# Patient Record
Sex: Female | Born: 1935 | Race: White | Hispanic: No | Marital: Married
Health system: Southern US, Community
[De-identification: ages and names within clinical notes are randomized; demographics above are authoritative.]

---

## 2005-01-26 ENCOUNTER — Ambulatory Visit: Payer: Self-pay | Admitting: Internal Medicine

## 2006-03-14 ENCOUNTER — Ambulatory Visit: Payer: Self-pay | Admitting: Internal Medicine

## 2006-05-06 ENCOUNTER — Ambulatory Visit: Payer: Self-pay | Admitting: Gastroenterology

## 2006-09-04 ENCOUNTER — Ambulatory Visit: Payer: Self-pay | Admitting: Internal Medicine

## 2007-12-30 ENCOUNTER — Ambulatory Visit: Payer: Self-pay | Admitting: Internal Medicine

## 2008-10-15 ENCOUNTER — Ambulatory Visit: Payer: Self-pay | Admitting: Ophthalmology

## 2008-10-25 ENCOUNTER — Ambulatory Visit: Payer: Self-pay | Admitting: Ophthalmology

## 2009-03-24 ENCOUNTER — Ambulatory Visit: Payer: Self-pay | Admitting: Internal Medicine

## 2009-04-16 ENCOUNTER — Emergency Department: Payer: Self-pay | Admitting: Emergency Medicine

## 2010-05-01 ENCOUNTER — Ambulatory Visit: Payer: Self-pay | Admitting: Internal Medicine

## 2010-11-20 ENCOUNTER — Ambulatory Visit: Payer: Self-pay | Admitting: Gastroenterology

## 2010-11-22 LAB — PATHOLOGY REPORT

## 2011-06-01 ENCOUNTER — Ambulatory Visit: Payer: Self-pay | Admitting: Internal Medicine

## 2011-11-19 ENCOUNTER — Emergency Department: Payer: Self-pay | Admitting: Internal Medicine

## 2011-11-29 ENCOUNTER — Encounter: Payer: Self-pay | Admitting: Internal Medicine

## 2011-12-13 ENCOUNTER — Inpatient Hospital Stay: Payer: Self-pay | Admitting: Internal Medicine

## 2011-12-18 LAB — CBC WITH DIFFERENTIAL/PLATELET
Basophil #: 0.1 10*3/uL (ref 0.0–0.1)
Basophil %: 0.6 %
Eosinophil #: 0.4 10*3/uL (ref 0.0–0.7)
HCT: 32 % — ABNORMAL LOW (ref 35.0–47.0)
HGB: 10.7 g/dL — ABNORMAL LOW (ref 12.0–16.0)
Lymphocyte %: 18.3 %
MCH: 28.6 pg (ref 26.0–34.0)
MCV: 85 fL (ref 80–100)
Monocyte #: 1.2 10*3/uL — ABNORMAL HIGH (ref 0.0–0.7)
Monocyte %: 9 %
Neutrophil %: 69.4 %
RDW: 14.2 % (ref 11.5–14.5)
WBC: 13.4 10*3/uL — ABNORMAL HIGH (ref 3.6–11.0)

## 2011-12-18 LAB — PROTIME-INR: Prothrombin Time: 24.3 secs — ABNORMAL HIGH (ref 11.5–14.7)

## 2011-12-19 LAB — PROTIME-INR: INR: 4.2

## 2011-12-20 LAB — BASIC METABOLIC PANEL
Anion Gap: 9 (ref 7–16)
Calcium, Total: 8.4 mg/dL — ABNORMAL LOW (ref 8.5–10.1)
Chloride: 99 mmol/L (ref 98–107)
Co2: 32 mmol/L (ref 21–32)
Creatinine: 0.58 mg/dL — ABNORMAL LOW (ref 0.60–1.30)
Potassium: 3.7 mmol/L (ref 3.5–5.1)
Sodium: 140 mmol/L (ref 136–145)

## 2011-12-20 LAB — CBC WITH DIFFERENTIAL/PLATELET
Basophil #: 0 10*3/uL (ref 0.0–0.1)
HCT: 31.2 % — ABNORMAL LOW (ref 35.0–47.0)
HGB: 10.2 g/dL — ABNORMAL LOW (ref 12.0–16.0)
Lymphocyte #: 1.8 10*3/uL (ref 1.0–3.6)
Lymphocyte %: 15.1 %
MCH: 28.4 pg (ref 26.0–34.0)
Monocyte #: 1.1 10*3/uL — ABNORMAL HIGH (ref 0.0–0.7)
Neutrophil #: 8.6 10*3/uL — ABNORMAL HIGH (ref 1.4–6.5)
Platelet: 364 10*3/uL (ref 150–440)
RDW: 14.5 % (ref 11.5–14.5)

## 2011-12-20 LAB — PROTIME-INR: Prothrombin Time: 35.9 secs — ABNORMAL HIGH (ref 11.5–14.7)

## 2011-12-21 ENCOUNTER — Encounter: Payer: Self-pay | Admitting: Internal Medicine

## 2011-12-25 LAB — BASIC METABOLIC PANEL
Calcium, Total: 8.4 mg/dL — ABNORMAL LOW (ref 8.5–10.1)
Chloride: 102 mmol/L (ref 98–107)
Co2: 28 mmol/L (ref 21–32)
Creatinine: 0.64 mg/dL (ref 0.60–1.30)
EGFR (African American): >60
Sodium: 138 mmol/L (ref 136–145)

## 2011-12-25 LAB — PROTIME-INR: Prothrombin Time: 24 secs — ABNORMAL HIGH (ref 11.5–14.7)

## 2011-12-25 LAB — CBC WITH DIFFERENTIAL/PLATELET
Basophil %: 0.4 %
Eosinophil #: 0.3 10*3/uL (ref 0.0–0.7)
HGB: 11 g/dL — ABNORMAL LOW (ref 12.0–16.0)
Lymphocyte %: 22.6 %
MCV: 87 fL (ref 80–100)
Monocyte #: 0.7 10*3/uL (ref 0.0–0.7)
Neutrophil #: 6.2 10*3/uL (ref 1.4–6.5)
Neutrophil %: 66.5 %
Platelet: 285 10*3/uL (ref 150–440)
RBC: 3.9 10*6/uL (ref 3.80–5.20)
RDW: 14.7 % — ABNORMAL HIGH (ref 11.5–14.5)
WBC: 9.3 10*3/uL (ref 3.6–11.0)

## 2011-12-27 LAB — PROTIME-INR: INR: 1.8

## 2012-01-03 LAB — PROTIME-INR
INR: 2.3
Prothrombin Time: 25.3 secs — ABNORMAL HIGH (ref 11.5–14.7)

## 2012-01-18 ENCOUNTER — Encounter: Payer: Self-pay | Admitting: Internal Medicine

## 2012-01-25 LAB — URINALYSIS, COMPLETE
Bilirubin,UR: NEGATIVE
Glucose,UR: NEGATIVE mg/dL (ref 0–75)
Ketone: NEGATIVE
Ph: 5 (ref 4.5–8.0)
Protein: 30
RBC,UR: 21 /HPF (ref 0–5)
Specific Gravity: 1.019 (ref 1.003–1.030)
Squamous Epithelial: NONE SEEN
WBC UR: 99 /HPF (ref 0–5)

## 2012-01-30 LAB — URINE CULTURE

## 2012-02-15 ENCOUNTER — Encounter: Payer: Self-pay | Admitting: Internal Medicine

## 2012-02-21 LAB — CBC WITH DIFFERENTIAL/PLATELET
Basophil %: 0.7 %
HCT: 29.5 % — ABNORMAL LOW (ref 35.0–47.0)
HGB: 9.4 g/dL — ABNORMAL LOW (ref 12.0–16.0)
Lymphocyte %: 30.9 %
MCH: 26 pg (ref 26.0–34.0)
MCHC: 31.8 g/dL — ABNORMAL LOW (ref 32.0–36.0)
MCV: 82 fL (ref 80–100)
Neutrophil #: 5.6 10*3/uL (ref 1.4–6.5)
Neutrophil %: 56.8 %
Platelet: 359 10*3/uL (ref 150–440)
RBC: 3.61 10*6/uL — ABNORMAL LOW (ref 3.80–5.20)
RDW: 17 % — ABNORMAL HIGH (ref 11.5–14.5)

## 2012-02-21 LAB — PROTIME-INR
INR: 7.2
Prothrombin Time: 60.7 secs — ABNORMAL HIGH (ref 11.5–14.7)

## 2012-02-22 LAB — PROTIME-INR: INR: 1.9

## 2012-02-25 LAB — PROTIME-INR: Prothrombin Time: 19.5 secs — ABNORMAL HIGH (ref 11.5–14.7)

## 2012-02-26 LAB — PROTIME-INR: INR: 2

## 2012-03-04 LAB — PROTIME-INR: Prothrombin Time: 27.2 secs — ABNORMAL HIGH (ref 11.5–14.7)

## 2012-03-11 LAB — PROTIME-INR
INR: 1.8
Prothrombin Time: 20.9 secs — ABNORMAL HIGH (ref 11.5–14.7)

## 2012-03-17 ENCOUNTER — Encounter: Payer: Self-pay | Admitting: Internal Medicine

## 2012-04-08 LAB — PROTIME-INR: Prothrombin Time: 21.5 secs — ABNORMAL HIGH (ref 11.5–14.7)

## 2012-04-16 ENCOUNTER — Encounter: Payer: Self-pay | Admitting: Internal Medicine

## 2012-04-17 LAB — PROTIME-INR: Prothrombin Time: 25.2 secs — ABNORMAL HIGH (ref 11.5–14.7)

## 2012-05-01 LAB — PROTIME-INR: INR: 1.2

## 2012-05-08 LAB — PROTIME-INR: Prothrombin Time: 16.9 secs — ABNORMAL HIGH (ref 11.5–14.7)

## 2012-05-13 LAB — PROTIME-INR: INR: 1.5

## 2012-05-15 LAB — PROTIME-INR: INR: 2.4

## 2012-05-22 ENCOUNTER — Ambulatory Visit: Payer: Self-pay | Admitting: Orthopedic Surgery

## 2012-05-22 ENCOUNTER — Encounter: Payer: Self-pay | Admitting: Internal Medicine

## 2012-05-22 LAB — PROTIME-INR: INR: 5.1

## 2012-05-26 LAB — PROTIME-INR
INR: 3.6
Prothrombin Time: 35.5 secs — ABNORMAL HIGH (ref 11.5–14.7)

## 2012-05-29 ENCOUNTER — Inpatient Hospital Stay: Payer: Self-pay | Admitting: Internal Medicine

## 2012-05-29 LAB — CBC WITH DIFFERENTIAL/PLATELET
Basophil #: 0.1 10*3/uL (ref 0.0–0.1)
Basophil %: 0.2 %
Eosinophil #: 0.5 10*3/uL (ref 0.0–0.7)
Eosinophil %: 2.3 %
HCT: 27.3 % — ABNORMAL LOW (ref 35.0–47.0)
HGB: 8.4 g/dL — ABNORMAL LOW (ref 12.0–16.0)
Lymphocyte %: 19.4 %
Lymphocyte %: 18.1 %
MCH: 27.3 pg (ref 26.0–34.0)
MCH: 26 pg (ref 26.0–34.0)
MCHC: 33 g/dL (ref 32.0–36.0)
MCV: 83 fL (ref 80–100)
MCV: 83 fL (ref 80–100)
Monocyte %: 7 %
Neutrophil #: 12.2 10*3/uL — ABNORMAL HIGH (ref 1.4–6.5)
Neutrophil %: 70.8 %
Neutrophil %: 70.5 %
Platelet: 474 10*3/uL — ABNORMAL HIGH (ref 150–440)
Platelet: 462 10*3/uL — ABNORMAL HIGH (ref 150–440)
RDW: 17.4 % — ABNORMAL HIGH (ref 11.5–14.5)
WBC: 15.5 10*3/uL — ABNORMAL HIGH (ref 3.6–11.0)

## 2012-05-29 LAB — COMPREHENSIVE METABOLIC PANEL
Alkaline Phosphatase: 105 U/L (ref 50–136)
Anion Gap: 11 (ref 7–16)
BUN: 21 mg/dL — ABNORMAL HIGH (ref 7–18)
Bilirubin,Total: 1.3 mg/dL — ABNORMAL HIGH (ref 0.2–1.0)
Calcium, Total: 8.3 mg/dL — ABNORMAL LOW (ref 8.5–10.1)
Chloride: 99 mmol/L (ref 98–107)
Co2: 28 mmol/L (ref 21–32)
Creatinine: 0.59 mg/dL — ABNORMAL LOW (ref 0.60–1.30)
EGFR (African American): >60
Osmolality: 280 (ref 275–301)
Potassium: 3.1 mmol/L — ABNORMAL LOW (ref 3.5–5.1)
SGOT(AST): 31 U/L (ref 15–37)
SGPT (ALT): 19 U/L
Sodium: 138 mmol/L (ref 136–145)
Total Protein: 6.7 g/dL (ref 6.4–8.2)

## 2012-05-29 LAB — URINALYSIS, COMPLETE
Blood: NEGATIVE
Nitrite: POSITIVE
Protein: 30
Specific Gravity: 1.024 (ref 1.003–1.030)

## 2012-05-29 LAB — CBC
HCT: 19.8 % — ABNORMAL LOW (ref 35.0–47.0)
HGB: 6.3 g/dL — ABNORMAL LOW (ref 12.0–16.0)
MCH: 25.2 pg — ABNORMAL LOW (ref 26.0–34.0)
MCV: 79 fL — ABNORMAL LOW (ref 80–100)

## 2012-05-29 LAB — PROTIME-INR
INR: 2.7
Prothrombin Time: 28.7 secs — ABNORMAL HIGH (ref 11.5–14.7)

## 2012-05-29 LAB — TROPONIN I: Troponin-I: 0.05 ng/mL

## 2012-05-29 LAB — OCCULT BLOOD X 1 CARD TO LAB, STOOL: Occult Blood, Feces: NEGATIVE

## 2012-05-30 LAB — CBC WITH DIFFERENTIAL/PLATELET
Basophil #: 0 10*3/uL (ref 0.0–0.1)
Basophil %: 0.2 %
Eosinophil %: 2.1 %
HGB: 8 g/dL — ABNORMAL LOW (ref 12.0–16.0)
Lymphocyte #: 2.3 10*3/uL (ref 1.0–3.6)
Lymphocyte %: 13.4 %
MCH: 26.5 pg (ref 26.0–34.0)
MCHC: 32.2 g/dL (ref 32.0–36.0)
MCV: 82 fL (ref 80–100)
Platelet: 481 10*3/uL — ABNORMAL HIGH (ref 150–440)
RBC: 3.02 10*6/uL — ABNORMAL LOW (ref 3.80–5.20)
RDW: 17.4 % — ABNORMAL HIGH (ref 11.5–14.5)

## 2012-05-30 LAB — BASIC METABOLIC PANEL
Anion Gap: 11 (ref 7–16)
Calcium, Total: 8.5 mg/dL (ref 8.5–10.1)
EGFR (Non-African Amer.): >60
Osmolality: 284 (ref 275–301)

## 2012-05-30 LAB — PROTIME-INR
INR: 1.1
Prothrombin Time: 15 secs — ABNORMAL HIGH (ref 11.5–14.7)

## 2012-05-31 LAB — CBC WITH DIFFERENTIAL/PLATELET
Eosinophil #: 0.6 10*3/uL (ref 0.0–0.7)
HGB: 8.2 g/dL — ABNORMAL LOW (ref 12.0–16.0)
MCH: 26.8 pg (ref 26.0–34.0)
MCV: 83 fL (ref 80–100)
Monocyte #: 1.3 x10 3/mm — ABNORMAL HIGH (ref 0.2–0.9)
Monocyte %: 9.7 %
Neutrophil %: 64.3 %
RDW: 17.4 % — ABNORMAL HIGH (ref 11.5–14.5)
WBC: 13 10*3/uL — ABNORMAL HIGH (ref 3.6–11.0)

## 2012-05-31 LAB — BASIC METABOLIC PANEL
Anion Gap: 8 (ref 7–16)
Calcium, Total: 7.9 mg/dL — ABNORMAL LOW (ref 8.5–10.1)
EGFR (African American): >60
Glucose: 114 mg/dL — ABNORMAL HIGH (ref 65–99)

## 2012-05-31 LAB — PROTIME-INR
INR: 1
Prothrombin Time: 13.8 secs (ref 11.5–14.7)

## 2012-05-31 LAB — URINE CULTURE

## 2012-06-01 LAB — CBC WITH DIFFERENTIAL/PLATELET
Basophil #: 0.1 10*3/uL (ref 0.0–0.1)
Basophil %: 0.4 %
Eosinophil #: 0.6 10*3/uL (ref 0.0–0.7)
Eosinophil %: 5 %
HCT: 26.1 % — ABNORMAL LOW (ref 35.0–47.0)
Lymphocyte %: 24.1 %
MCH: 26.8 pg (ref 26.0–34.0)
MCHC: 32 g/dL (ref 32.0–36.0)
MCV: 84 fL (ref 80–100)
Monocyte #: 1 x10 3/mm — ABNORMAL HIGH (ref 0.2–0.9)
Neutrophil #: 7.9 10*3/uL — ABNORMAL HIGH (ref 1.4–6.5)
Platelet: 454 10*3/uL — ABNORMAL HIGH (ref 150–440)
RBC: 3.12 10*6/uL — ABNORMAL LOW (ref 3.80–5.20)
WBC: 12.7 10*3/uL — ABNORMAL HIGH (ref 3.6–11.0)

## 2012-06-01 LAB — BASIC METABOLIC PANEL
Anion Gap: 10 (ref 7–16)
BUN: 6 mg/dL — ABNORMAL LOW (ref 7–18)
Calcium, Total: 8.2 mg/dL — ABNORMAL LOW (ref 8.5–10.1)
Co2: 27 mmol/L (ref 21–32)
Creatinine: 0.46 mg/dL — ABNORMAL LOW (ref 0.60–1.30)
EGFR (Non-African Amer.): >60
Potassium: 3.4 mmol/L — ABNORMAL LOW (ref 3.5–5.1)

## 2012-06-01 LAB — PROTIME-INR: INR: 1.1

## 2012-06-02 LAB — CBC WITH DIFFERENTIAL/PLATELET
Basophil %: 0.5 %
Eosinophil #: 0.8 10*3/uL — ABNORMAL HIGH (ref 0.0–0.7)
HCT: 27.3 % — ABNORMAL LOW (ref 35.0–47.0)
HGB: 8.6 g/dL — ABNORMAL LOW (ref 12.0–16.0)
Lymphocyte #: 2.7 10*3/uL (ref 1.0–3.6)
Lymphocyte %: 19.1 %
MCHC: 31.6 g/dL — ABNORMAL LOW (ref 32.0–36.0)
Monocyte %: 8 %
Monocytes: 3 %
Neutrophil #: 9.5 10*3/uL — ABNORMAL HIGH (ref 1.4–6.5)
RBC: 3.27 10*6/uL — ABNORMAL LOW (ref 3.80–5.20)
RDW: 18.1 % — ABNORMAL HIGH (ref 11.5–14.5)
Segmented Neutrophils: 67 %
WBC: 14.2 10*3/uL — ABNORMAL HIGH (ref 3.6–11.0)

## 2012-06-02 LAB — BASIC METABOLIC PANEL
Anion Gap: 7 (ref 7–16)
BUN: 7 mg/dL (ref 7–18)
Chloride: 108 mmol/L — ABNORMAL HIGH (ref 98–107)
Creatinine: 0.49 mg/dL — ABNORMAL LOW (ref 0.60–1.30)
EGFR (African American): >60
EGFR (Non-African Amer.): >60
Osmolality: 285 (ref 275–301)
Potassium: 4.1 mmol/L (ref 3.5–5.1)

## 2012-06-02 LAB — TSH: Thyroid Stimulating Horm: 2.95 u[IU]/mL

## 2012-06-03 LAB — CSF CELL CT + PROT + GLU PANEL
Lymphocytes: 84 %
Monocytes/Macrophages: 4 %
Neutrophils: 12 %
Protein, CSF: 46 mg/dL — ABNORMAL HIGH (ref 15–45)
WBC (CSF): 3 /mm3

## 2012-06-03 LAB — PROTIME-INR
INR: 1.1
Prothrombin Time: 14.1 secs (ref 11.5–14.7)

## 2012-06-03 LAB — APTT: Activated PTT: 31.2 secs (ref 23.6–35.9)

## 2012-06-03 LAB — INDIA INK CSF

## 2012-06-04 LAB — BASIC METABOLIC PANEL WITH GFR
Anion Gap: 9
BUN: 6 mg/dL — ABNORMAL LOW
Calcium, Total: 8.1 mg/dL — ABNORMAL LOW
Chloride: 107 mmol/L
Co2: 25 mmol/L
Creatinine: 0.65 mg/dL
EGFR (African American): >60
EGFR (Non-African Amer.): >60
Glucose: 132 mg/dL — ABNORMAL HIGH
Osmolality: 281
Potassium: 3.3 mmol/L — ABNORMAL LOW
Sodium: 141 mmol/L

## 2012-06-04 LAB — CBC WITH DIFFERENTIAL/PLATELET
Eosinophil %: 3.3 %
HCT: 27.4 % — ABNORMAL LOW (ref 35.0–47.0)
Lymphocyte #: 1.9 10*3/uL (ref 1.0–3.6)
MCV: 83 fL (ref 80–100)
Monocyte #: 1.1 x10 3/mm — ABNORMAL HIGH (ref 0.2–0.9)
Monocyte %: 8.4 %
Neutrophil #: 9.2 10*3/uL — ABNORMAL HIGH (ref 1.4–6.5)
Neutrophil %: 73.2 %
Platelet: 373 10*3/uL (ref 150–440)
RDW: 18.6 % — ABNORMAL HIGH (ref 11.5–14.5)
WBC: 12.6 10*3/uL — ABNORMAL HIGH (ref 3.6–11.0)

## 2012-06-04 LAB — CULTURE, BLOOD (SINGLE)

## 2012-06-05 LAB — CBC WITH DIFFERENTIAL/PLATELET
Basophil #: 0 10*3/uL (ref 0.0–0.1)
Basophil %: 0.4 %
Eosinophil #: 0 10*3/uL (ref 0.0–0.7)
Eosinophil %: 0 %
HCT: 29 % — ABNORMAL LOW (ref 35.0–47.0)
Lymphocyte %: 13.1 %
MCH: 27.2 pg (ref 26.0–34.0)
MCHC: 32.6 g/dL (ref 32.0–36.0)
Monocyte %: 0.6 %
Neutrophil #: 8.2 10*3/uL — ABNORMAL HIGH (ref 1.4–6.5)
Platelet: 367 10*3/uL (ref 150–440)
WBC: 9.5 10*3/uL (ref 3.6–11.0)

## 2012-06-05 LAB — BASIC METABOLIC PANEL
Anion Gap: 5 — ABNORMAL LOW (ref 7–16)
Co2: 28 mmol/L (ref 21–32)
EGFR (African American): >60
Glucose: 169 mg/dL — ABNORMAL HIGH (ref 65–99)
Potassium: 4.2 mmol/L (ref 3.5–5.1)

## 2012-06-06 LAB — CBC WITH DIFFERENTIAL/PLATELET
Basophil #: 0 10*3/uL (ref 0.0–0.1)
Basophil %: 0.1 %
Eosinophil #: 0 10*3/uL (ref 0.0–0.7)
Eosinophil %: 0 %
HCT: 28.7 % — ABNORMAL LOW (ref 35.0–47.0)
HGB: 9.3 g/dL — ABNORMAL LOW (ref 12.0–16.0)
Lymphocyte %: 11.9 %
MCH: 27.2 pg (ref 26.0–34.0)
MCV: 84 fL (ref 80–100)
Monocyte %: 3.5 %
Neutrophil #: 12.1 10*3/uL — ABNORMAL HIGH (ref 1.4–6.5)
Neutrophil %: 84.5 %
Platelet: 398 10*3/uL (ref 150–440)
RDW: 18.8 % — ABNORMAL HIGH (ref 11.5–14.5)
WBC: 14.3 10*3/uL — ABNORMAL HIGH (ref 3.6–11.0)

## 2012-06-08 LAB — CBC WITH DIFFERENTIAL/PLATELET
Basophil #: 0 10*3/uL (ref 0.0–0.1)
Eosinophil #: 0 10*3/uL (ref 0.0–0.7)
Eosinophil %: 0.4 %
Lymphocyte #: 2.5 10*3/uL (ref 1.0–3.6)
MCH: 26.4 pg (ref 26.0–34.0)
Monocyte #: 0.8 x10 3/mm (ref 0.2–0.9)
Monocyte %: 7.9 %
Platelet: 353 10*3/uL (ref 150–440)
RBC: 3.64 10*6/uL — ABNORMAL LOW (ref 3.80–5.20)
RDW: 19 % — ABNORMAL HIGH (ref 11.5–14.5)

## 2012-06-08 LAB — BASIC METABOLIC PANEL
Anion Gap: 7 (ref 7–16)
Calcium, Total: 8.4 mg/dL — ABNORMAL LOW (ref 8.5–10.1)
Chloride: 103 mmol/L (ref 98–107)
Co2: 28 mmol/L (ref 21–32)
Creatinine: 0.67 mg/dL (ref 0.60–1.30)
EGFR (African American): >60
EGFR (Non-African Amer.): >60
Glucose: 86 mg/dL (ref 65–99)

## 2012-06-10 LAB — BASIC METABOLIC PANEL
Anion Gap: 8 (ref 7–16)
BUN: 9 mg/dL (ref 7–18)
Calcium, Total: 8.1 mg/dL — ABNORMAL LOW (ref 8.5–10.1)
Chloride: 104 mmol/L (ref 98–107)
Co2: 27 mmol/L (ref 21–32)
Creatinine: 0.57 mg/dL — ABNORMAL LOW (ref 0.60–1.30)
EGFR (African American): >60
EGFR (Non-African Amer.): >60
Glucose: 100 mg/dL — ABNORMAL HIGH (ref 65–99)
Osmolality: 276 (ref 275–301)
Potassium: 3.3 mmol/L — ABNORMAL LOW (ref 3.5–5.1)
Sodium: 139 mmol/L (ref 136–145)

## 2012-06-11 LAB — BASIC METABOLIC PANEL
Anion Gap: 8 (ref 7–16)
BUN: 14 mg/dL (ref 7–18)
Calcium, Total: 8.9 mg/dL (ref 8.5–10.1)
Chloride: 102 mmol/L (ref 98–107)
Creatinine: 0.86 mg/dL (ref 0.60–1.30)
EGFR (African American): >60
EGFR (Non-African Amer.): >60
Glucose: 122 mg/dL — ABNORMAL HIGH (ref 65–99)

## 2012-06-12 LAB — BASIC METABOLIC PANEL
Anion Gap: 7 (ref 7–16)
BUN: 16 mg/dL (ref 7–18)
Calcium, Total: 8.6 mg/dL (ref 8.5–10.1)
Chloride: 104 mmol/L (ref 98–107)
Co2: 30 mmol/L (ref 21–32)
Creatinine: 0.79 mg/dL (ref 0.60–1.30)
EGFR (African American): >60
Glucose: 115 mg/dL — ABNORMAL HIGH (ref 65–99)
Osmolality: 283 (ref 275–301)
Sodium: 141 mmol/L (ref 136–145)

## 2012-06-12 LAB — SEDIMENTATION RATE: Erythrocyte Sed Rate: 13 mm/hr (ref 0–30)

## 2012-06-13 LAB — BASIC METABOLIC PANEL
BUN: 17 mg/dL (ref 7–18)
Co2: 31 mmol/L (ref 21–32)
EGFR (African American): >60
Glucose: 120 mg/dL — ABNORMAL HIGH (ref 65–99)
Potassium: 4.1 mmol/L (ref 3.5–5.1)
Sodium: 140 mmol/L (ref 136–145)

## 2012-06-16 ENCOUNTER — Encounter: Payer: Self-pay | Admitting: Internal Medicine

## 2012-07-17 ENCOUNTER — Encounter: Payer: Self-pay | Admitting: Internal Medicine

## 2012-07-17 DEATH — deceased

## 2013-05-27 IMAGING — CT CT HEAD WITHOUT CONTRAST
2 series · 15 of 30 positions shown, 19 images · non-contrast
Comparison: none

REASON FOR EXAM: weak r side slurred speach
COMMENTS:

[Series 2: without · axial · non-contrast · 0.40mm/px · z∈[+371,+496]mm · 13 of 31 slices shown, 17 images]
[im 3/31  brain]
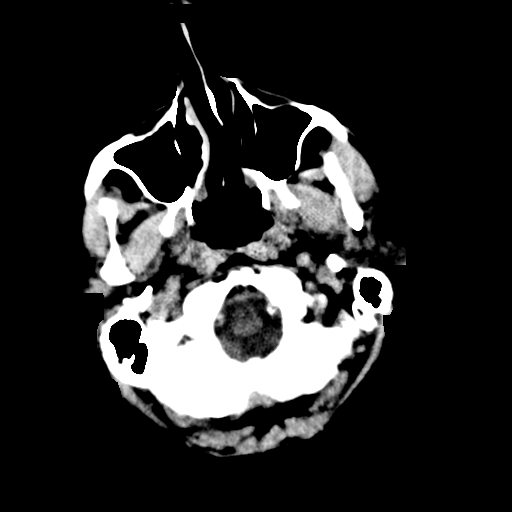
[im 3/31  bone]
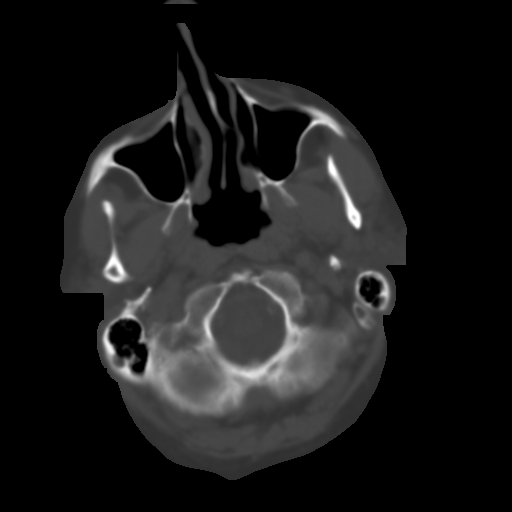
[im 5/31  brain]
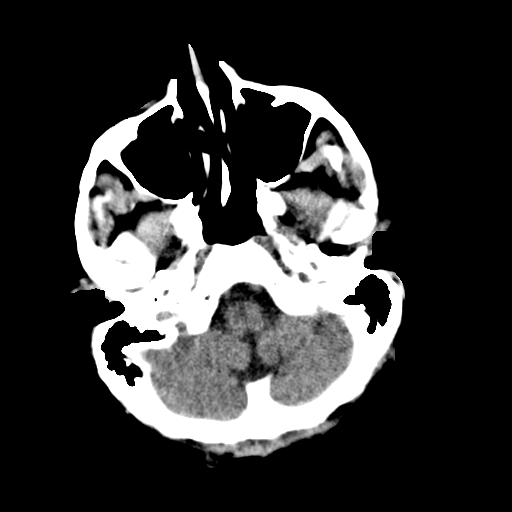
[im 7/31  brain]
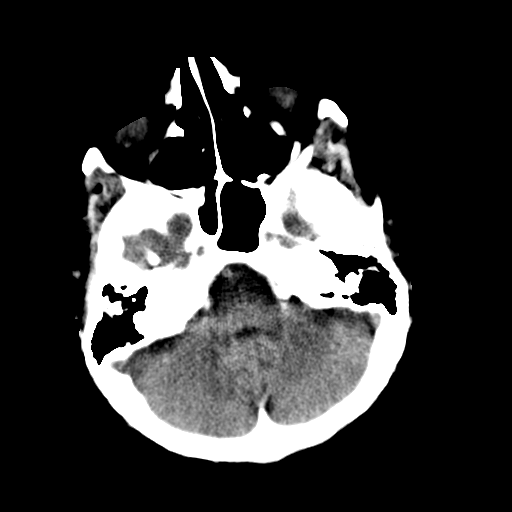
[im 9/31  brain]
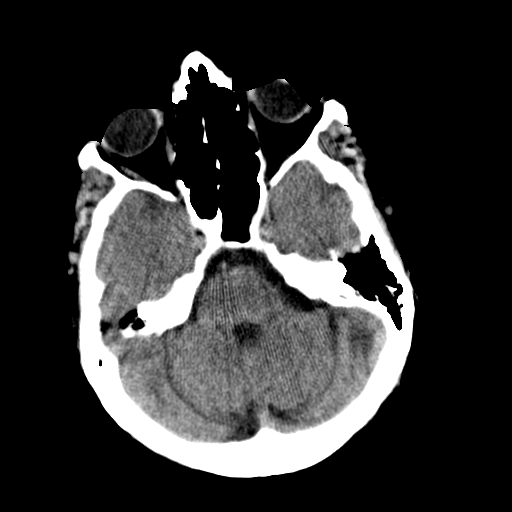
[im 11/31  brain]
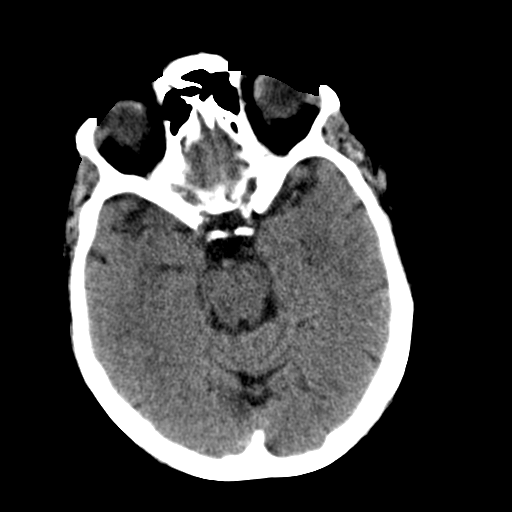
[im 11/31  bone]
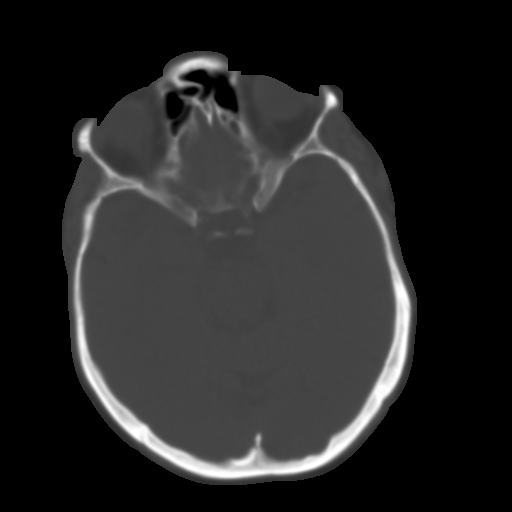
[im 13/31  brain]
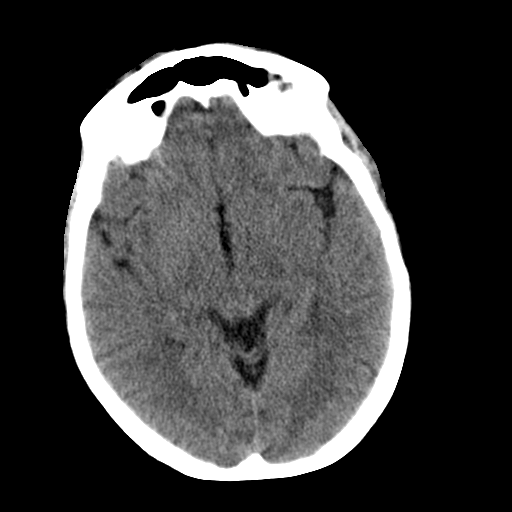
[im 16/31  brain]
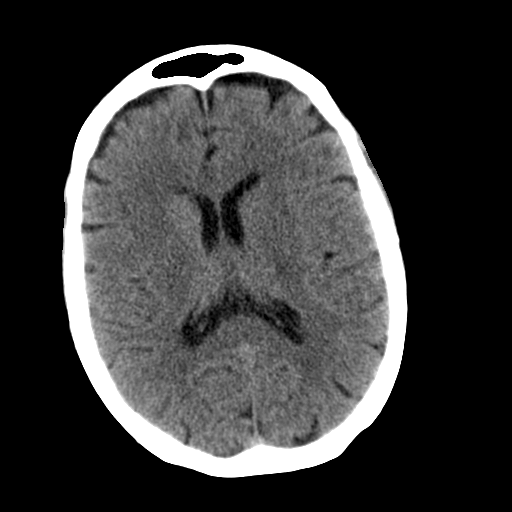
[im 18/31  brain]
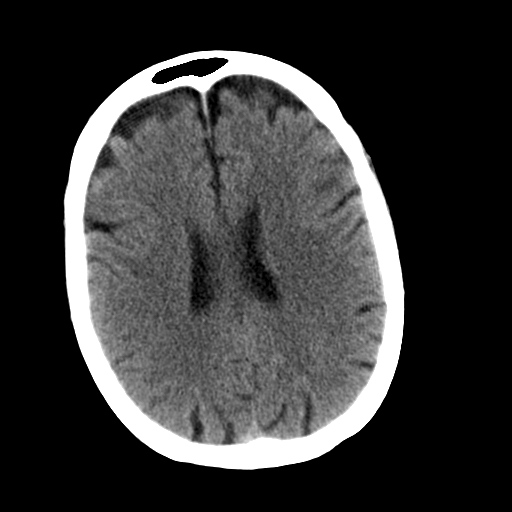
[im 20/31  brain]
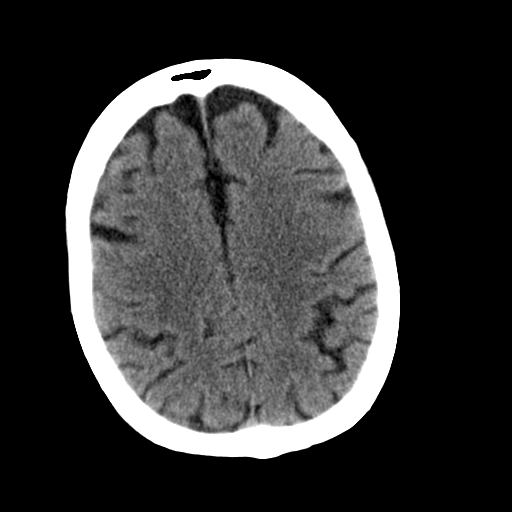
[im 20/31  bone]
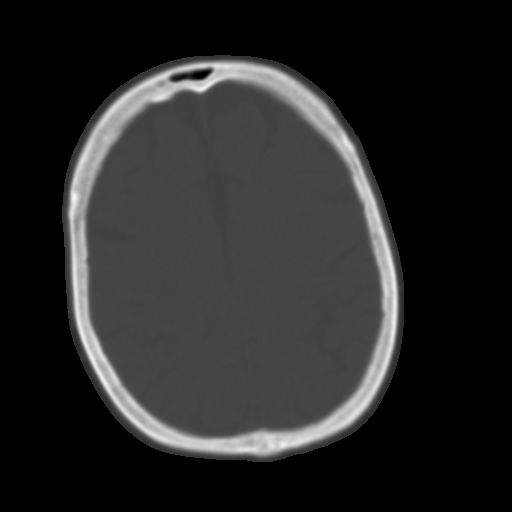
[im 22/31  brain]
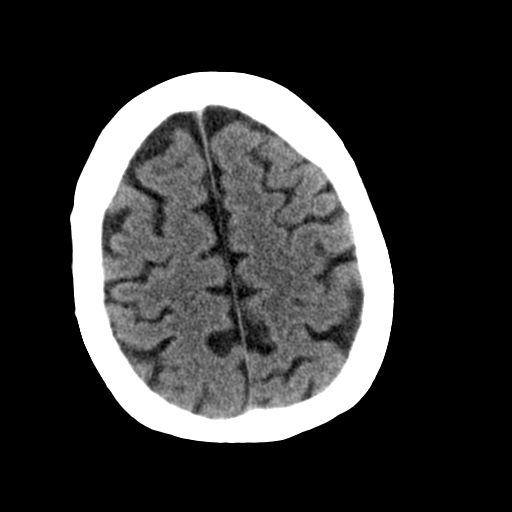
[im 24/31  brain]
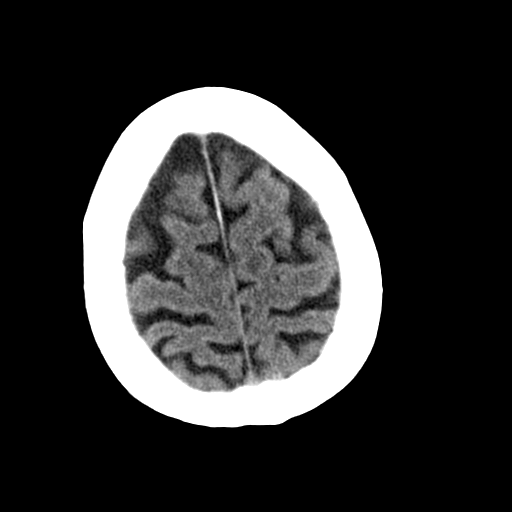
[im 26/31  brain]
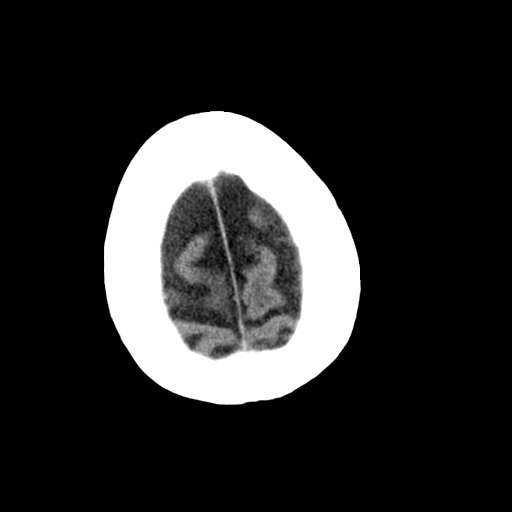
[im 28/31  brain]
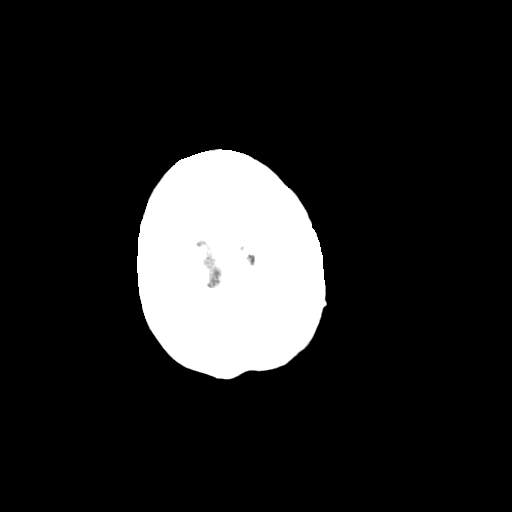
[im 28/31  bone]
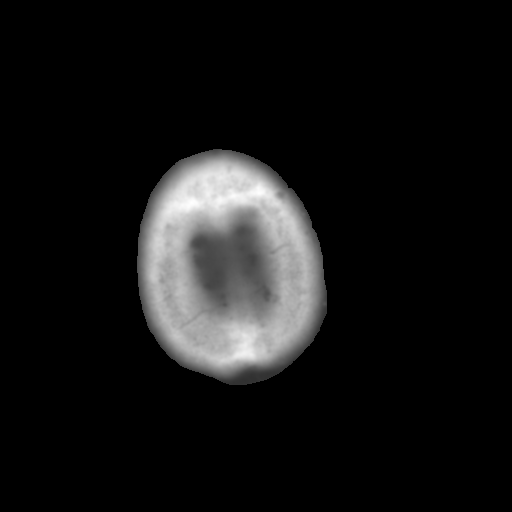

[Series 3: bone · axial · 0.40mm/px · z∈[+371,+391]mm · 2 of 31 slices shown]
[im 3/31  bone]
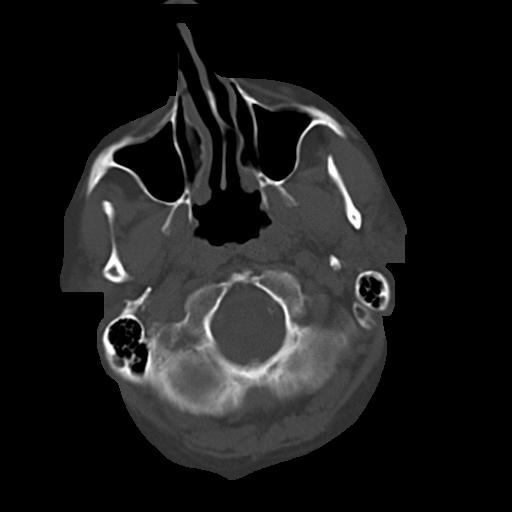
[im 7/31  bone]
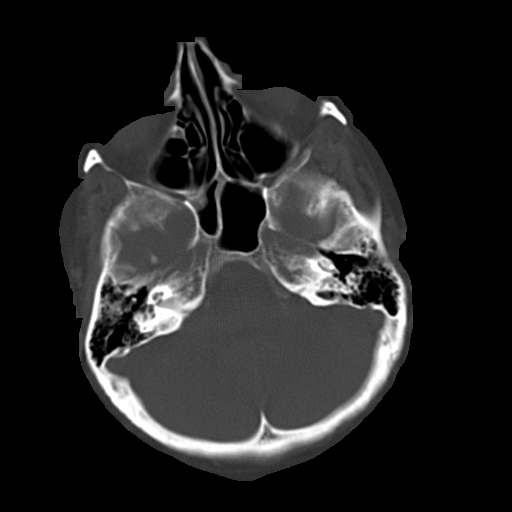

[15 of 30 positions shown; findings below may reference images not displayed]

PROCEDURE:     CT  - CT HEAD WITHOUT CONTRAST  - November 19, 2011  [DATE]

RESULT:     Axial noncontrast CT scanning was performed through the brain
with reconstructions at 5 mm intervals and slice thicknesses.

There is an abnormal appearance of the left middle cerebral artery
consistent with a hyperdense MCA sign which can be seen with early ischemic
change. There is subtle decreased density in the left cerebral hemisphere as
compared to the right. There is no shift of the midline. There is no acute
intracranial hemorrhage. There is mild diffuse cerebral atrophy. The
cerebellum and brainstem are normal in density. At bone window settings the
observed portions of the paranasal sinuses and mastoid air cells are clear.
There is no evidence of an acute skull fracture.
IMPRESSION: 1. I am concerned that there is early ischemic change in the left cerebral
hemisphere manifested chiefly by a hyperdense left middle cerebral artery
which may reflect acute thrombus. Subtle decreased density in the left
cerebral hemisphere as compared to the right is present as well which is
worrisome for evolving ischemic change.
2. There is no shift of the midline.
3. There is no acute intracranial hemorrhage.

This report was called by me to Dr. Locklear in the [HOSPITAL]
the conclusion of the study.

## 2013-05-27 IMAGING — CR DG CHEST 2V
1 series · 4 of 4 positions shown · non-contrast
Comparison: none

REASON FOR EXAM: cp
COMMENTS:

[Series 1: x chest ap · 0.14mm/px · 4 of 4 slices shown]
[im 1/4]
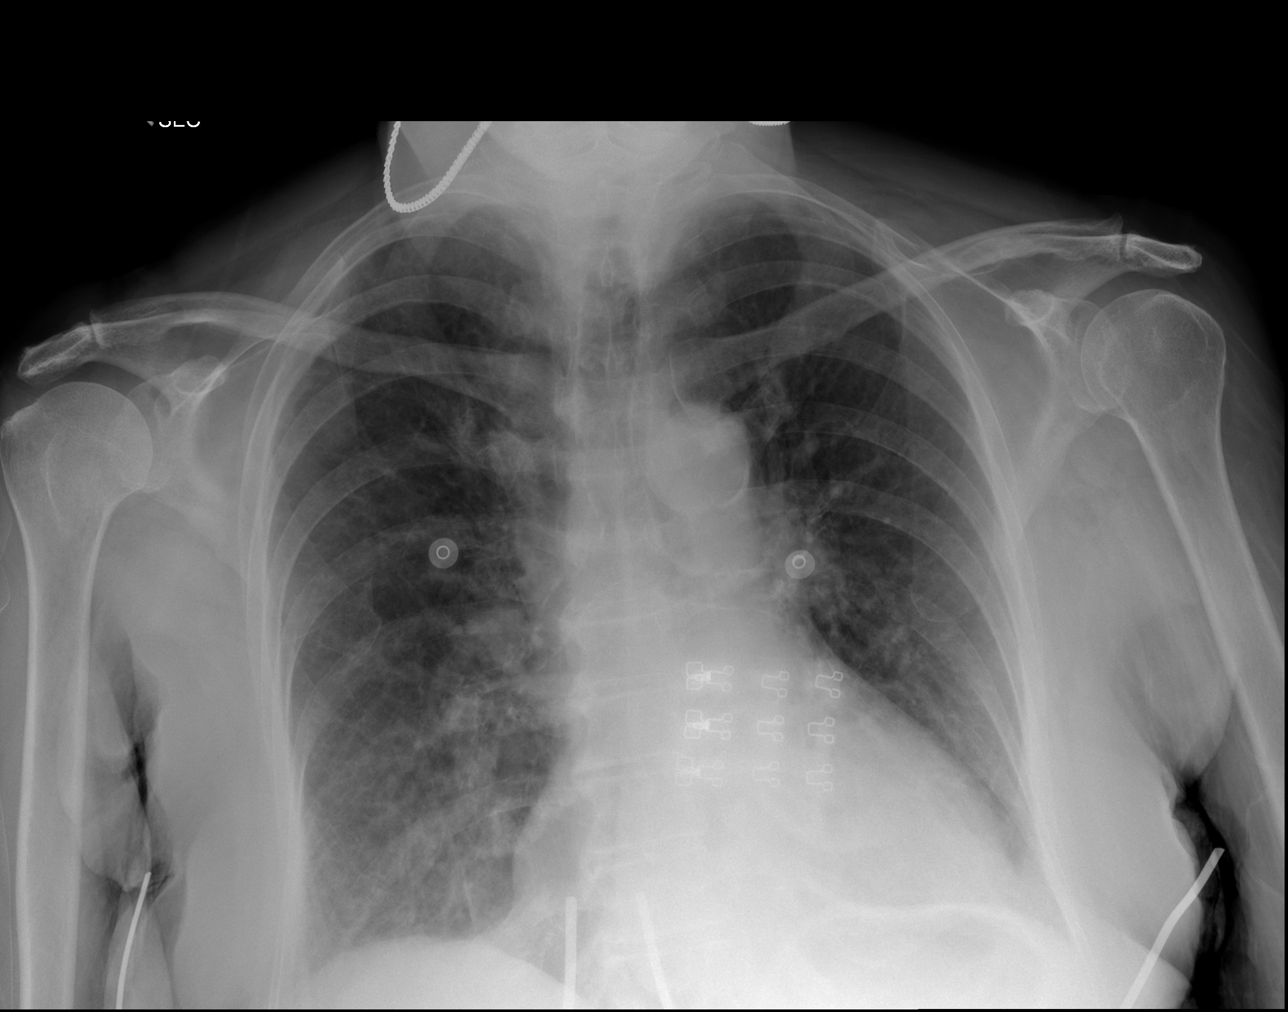
[im 2/4]
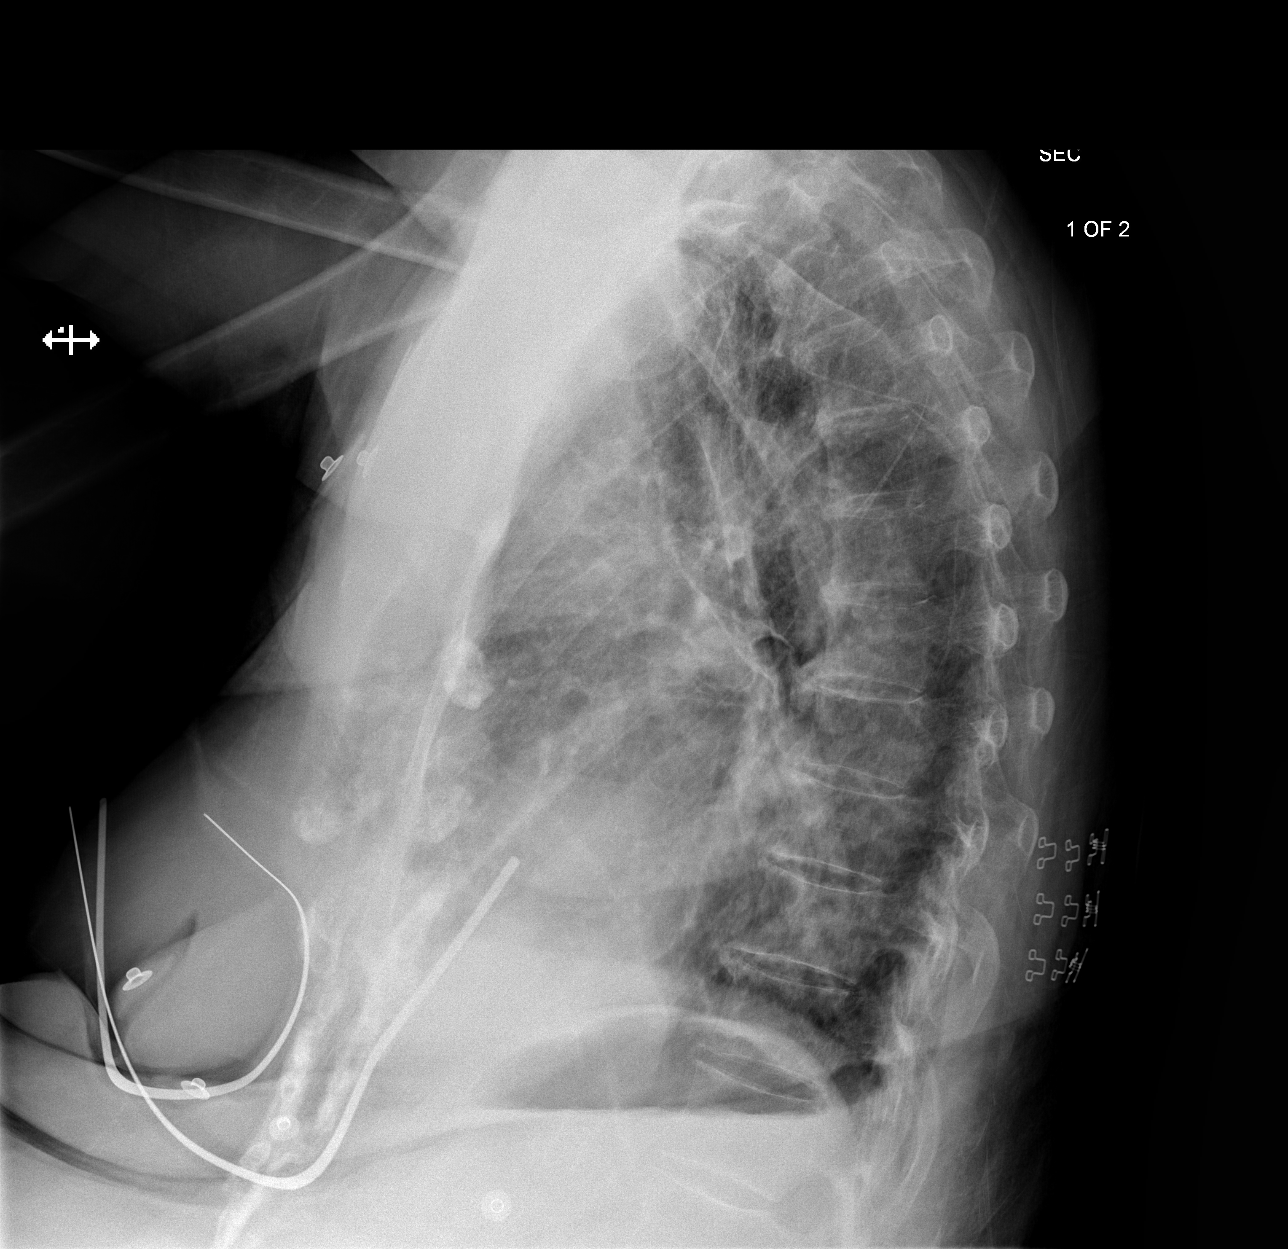
[im 3/4]
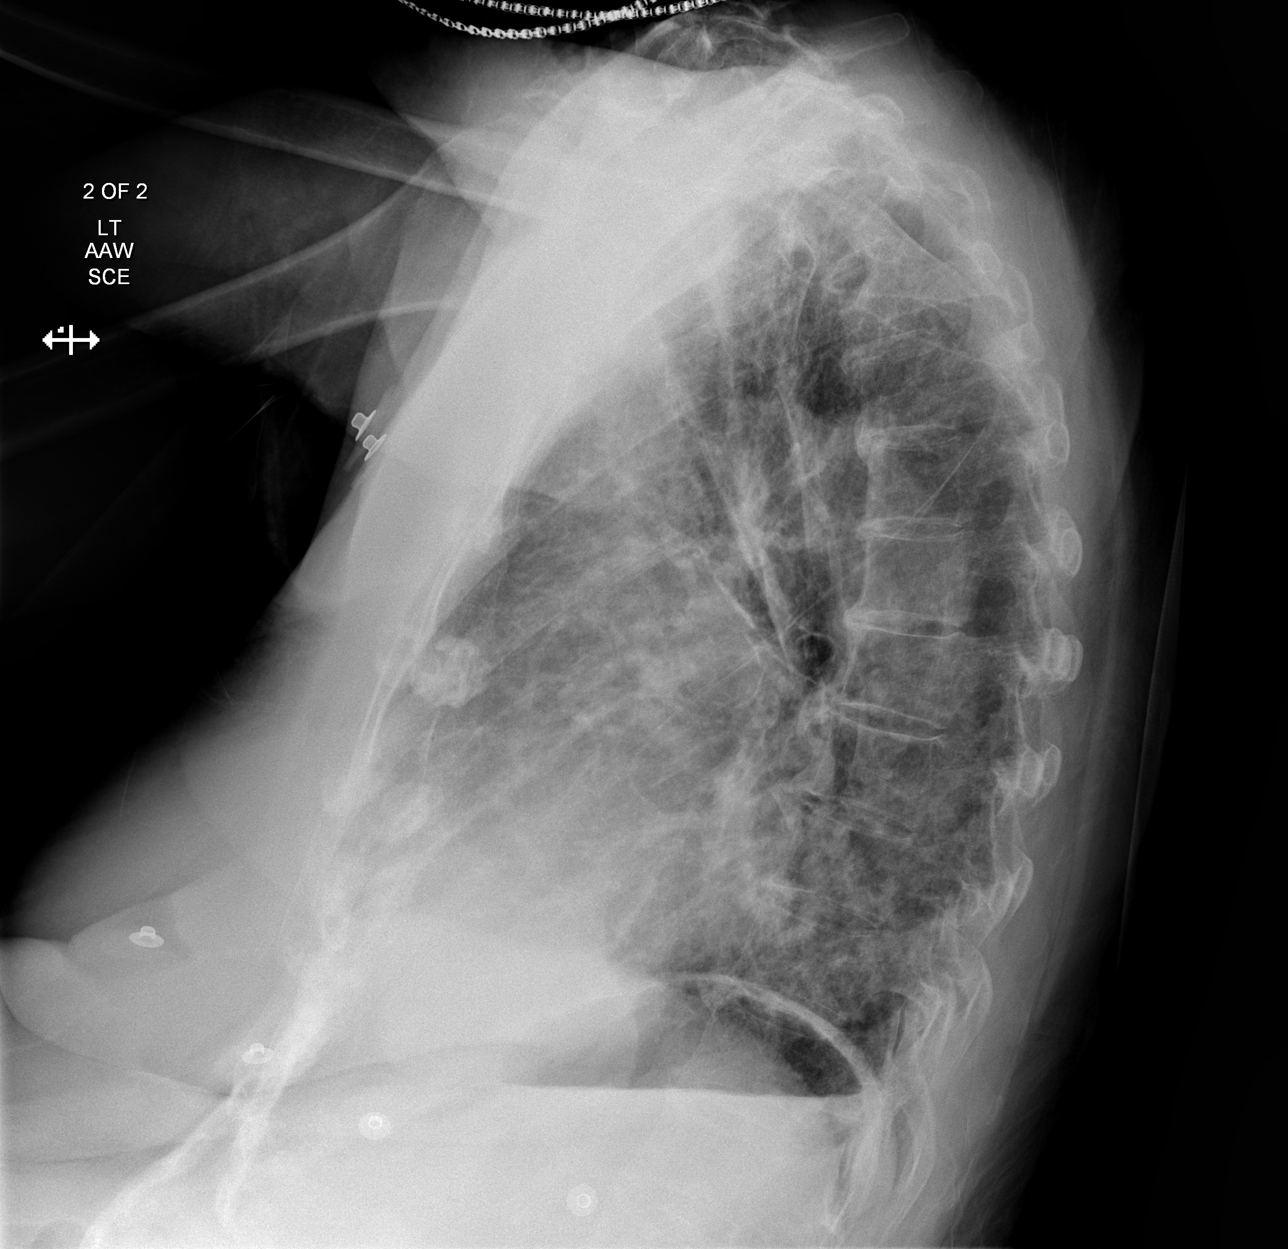
[im 4/4]
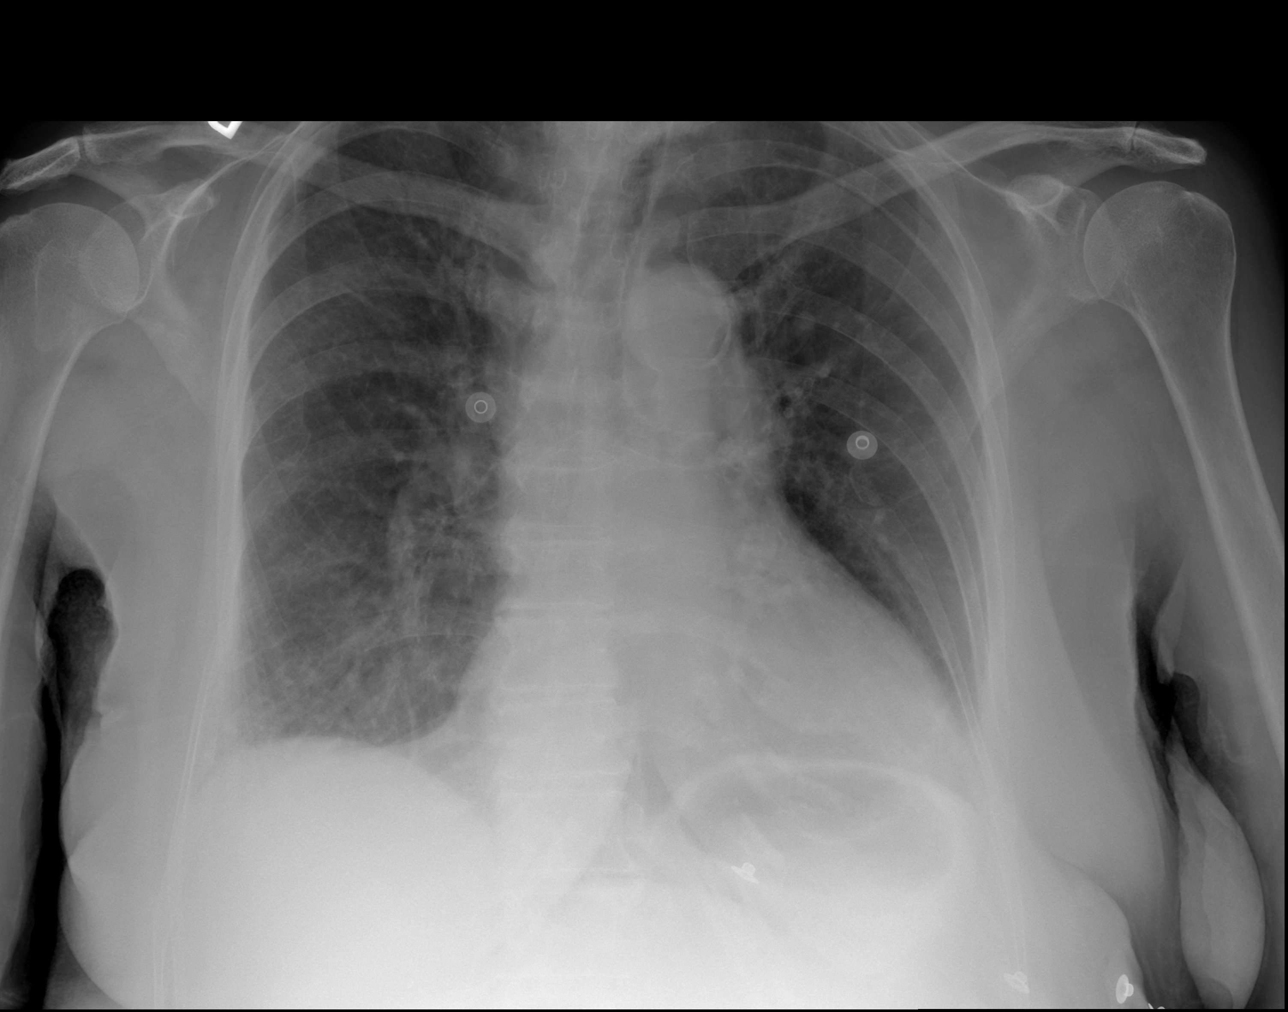

[4 of 4 positions shown; findings below may reference images not displayed]

PROCEDURE:     DXR - DXR CHEST PA (OR AP) AND LATERAL  - November 19, 2011  [DATE]

RESULT:     Comparison made to the study April 17, 2009.

The lungs are hyperinflated with hemidiaphragm flattening. The cardiac
silhouette is mildly enlarged. The pulmonary vascularity is less distinct
today. I see no alveolar infiltrate or pleural effusion. There are mild
degenerative disc changes of the thoracic spine.
IMPRESSION: There are findings consistent with COPD. I do not see
evidence of pneumonia. I cannot exclude low-grade compensated CHF.

## 2013-06-20 IMAGING — CT CT NECK WITH CONTRAST
1 of 3 series · 6 of 14 positions shown, 8 images · IV contrast (agent unspecified)
Comparison: none

REASON FOR EXAM: atraumatic pain, swelling
COMMENTS:

PROCEDURE:     CT  - CT NECK WITH CONTRAST  - December 13, 2011  [DATE]
RESULT:     History: Soft tissue swelling and pain.
Comparison Study: Head CT of 11/19/2011.

[Series 2: soft tissue · axial · 0.51mm/px · z∈[+47,+245]mm · 6 of 94 slices shown, 8 images]
[im 14/94  soft-tissue]
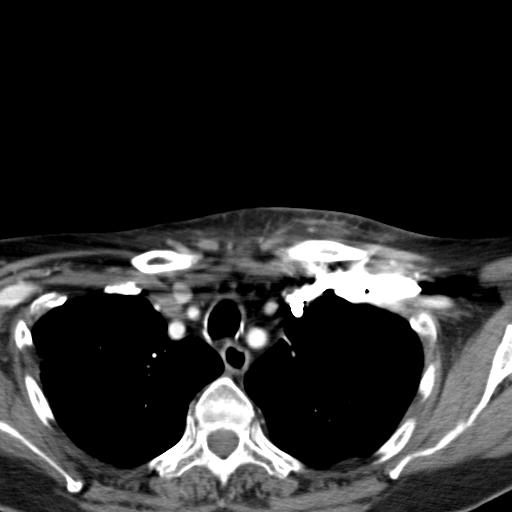
[im 14/94  bone]
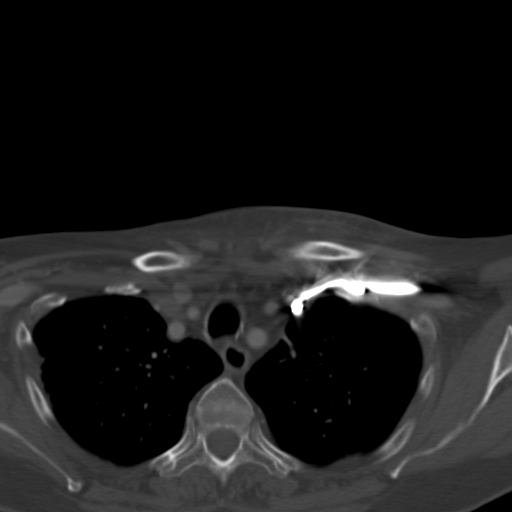
[im 27/94  bone]
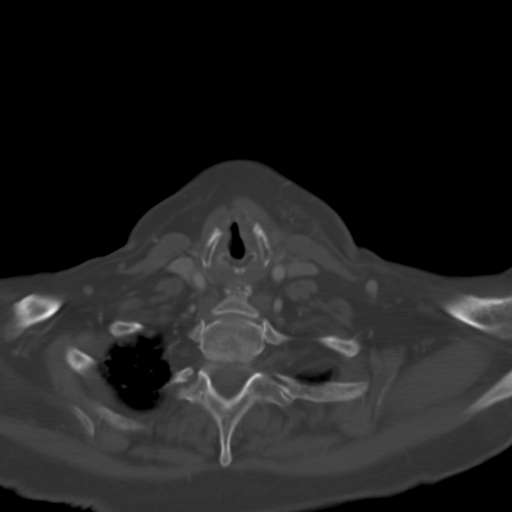
[im 40/94  bone]
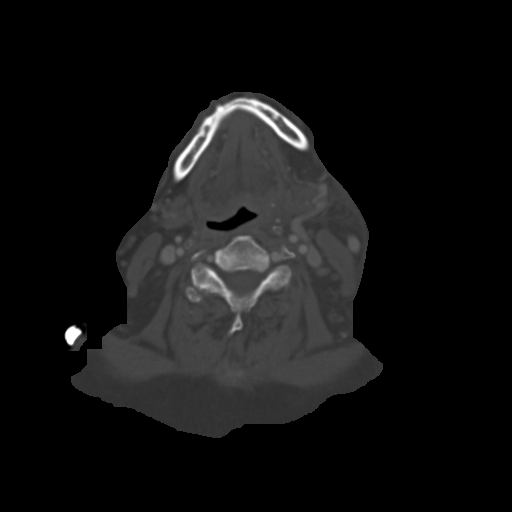
[im 54/94  bone]
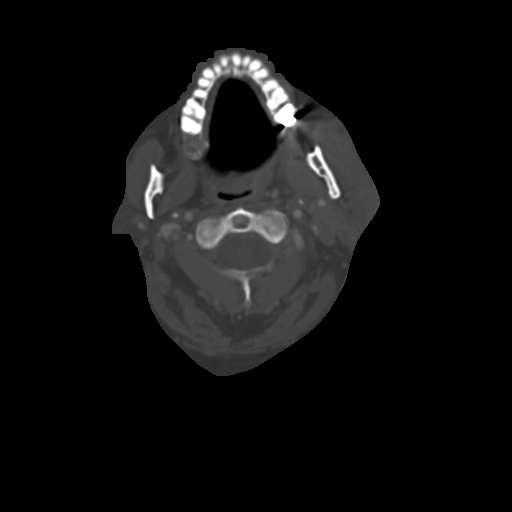
[im 67/94  soft-tissue]
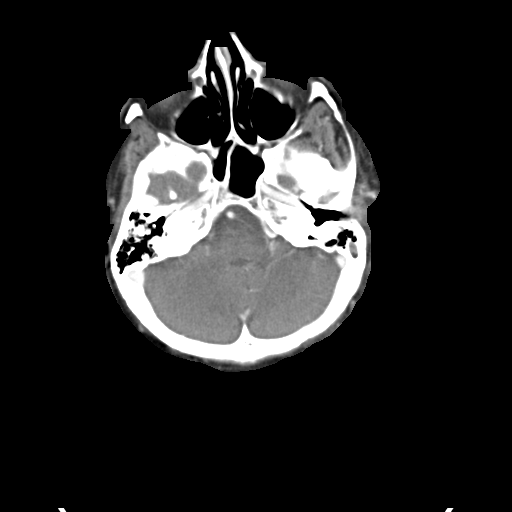
[im 67/94  bone]
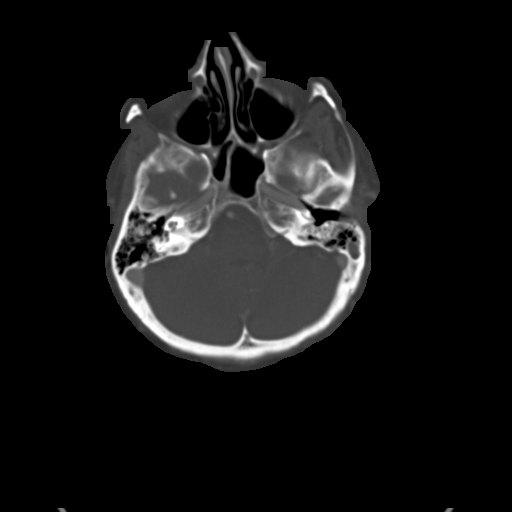
[im 80/94  bone]
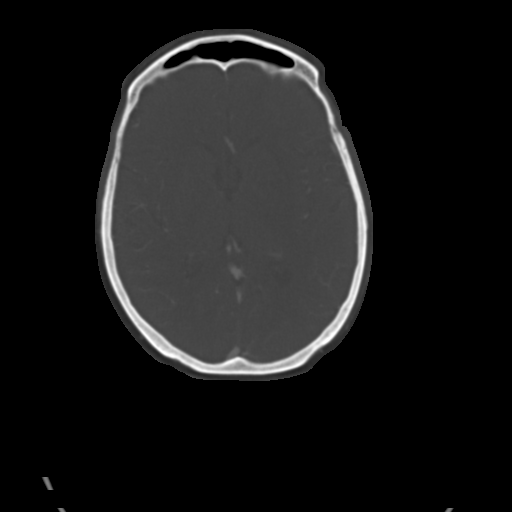

[6 of 14 positions shown; findings below may reference images not displayed]

FINDINGS: Standard CT obtained with 75 cc of [id number]. Paranasal sinuses
clear. Questionable enhancing lesion is noted in the left periventricular
white matter. MRI the brain is suggested for further evaluation. Left
parotid gland is swollen. Parotitis could present this fashion. No clear cut
mass is noted. Mild adjacent soft tissue swelling present Submandibular the
left is also slightly large. Salivary ductal stone cannot be completely
excluded and a followup nonenhanced scan may prove useful. Small left
anterior cervical lymph nodes are noted largest measuring 7 mm in is in the
left jugular chain. Asymmetric soft tissue fullness is no the left pharynx.
Direct visualization suggested to exclude mass lesion. Larynx is normal.
Thyroid is normal. Lung apices demonstrate interstitial thickening. Mucous
noted in the trachea.
IMPRESSION: 1. Left parotid gland and left submandibular gland enlargement. These
changes may be inflammatory. Salivary ductal stone cannot be excluded on the
left. A followup nonenhanced neck CT suggested. Adjacent mild soft tissue
swelling is present. Component cellulitis cannot be excluded.
2. Soft tissue fullness in the left pharyngeal region mass lesion cannot be
excluded and direct visualization suggested.
3. Questionable small enhancing lesion left periventricular white matter.
MRI the brain suggested.

## 2015-04-10 NOTE — Discharge Summary (Signed)
PATIENT NAMEERIYAH, [name] MR#:  [id number] DATE OF BIRTH:  Jun 15, 1928  DATE OF ADMISSION:  12/13/2011 DATE OF DISCHARGE:  12/20/2011  FINAL DIAGNOSES:  1. Left-sided parotitis and cellulitis secondary to methicillin-sensitive Staphylococcus aureus.  2. Dehydration.  3. Recent cerebrovascular accident with right side hemiplegia.  4. Atrial fibrillation.  5. Hypertension.  6. Hyperlipidemia.   HISTORY AND PHYSICAL: Please see dictated admission History and Physical.   [place] COURSE: The patient was admitted with marked swelling in the left side of the jaw, inability to eat secondary to pain, and evidence of markedly elevated white blood cell count of 36,000, consistent with early sepsis. She was placed on broad-spectrum antibiotics. Ear, nose, and throat consultation was obtained, and culture was sent, which revealed methicillin-sensitive Staphylococcus aureus. She was adjusted over to oxacillin and showed continued improvement in her blood counts. She became afebrile as well. She had had recent stroke, was at skilled nursing for this, had PEG tube placed. She had been cleared to start a diet, but had not yet started one. Speech therapy saw the patient, and puree with thin liquids was recommended. Once the swelling in her mouth improved, she was in fact able to tolerate this diet. Her medications have been given through the gastrostomy tube, however, unfortunately this became plugged. Gastroenterology consultation was obtained, however, it could not be cleared. Fortunately her oral intake continued to improve enough that it was felt that she no longer needed tube feedings. Her calorie intake and fluid intake was monitored, and she appeared to be taking this adequately. She was changed over to oral antibiotics and remained afebrile, again tolerating these medications. It was felt that she was stable to return to skilled nursing to resume rehabilitation, and at this time she will be  discharged in stable condition with her physical activity up with assistance as tolerated. She will be on fall precautions, bleeding precautions, and swallowing precautions. Her diet will be puree with thin liquids. She will have a Foley to gravity, as this was attempted to be discontinued during this hospitalization, but she showed continued evidence of urinary retention. Followup appointments will be made with ear, nose and throat to monitor her parotitis, as well as a urology consultation for urinary retention. Physical therapy, occupational therapy, and speech therapy should evaluate and treat the patient. She will need a CBC, MET-B, and INR in four days with results to the nursing home physician.   DISCHARGE MEDICATIONS:  1. Amantadine 100 mg p.o. twice a day; this is being used as an adjunct to increase rehabilitation potential in a post-stroke patient with aphagia.   2. Flomax 0.4 mg p.o. at bedtime for urinary retention.  3. Aspirin chewable 81 mg p.o. daily.  4. Lipitor 40 mg p.o. at bedtime.  5. Cymbalta 60 mg p.o. daily for depression/anxiety.  6. Remeron 15 mg p.o. at bedtime for depression/anxiety.  7. Losartan 25 mg p.o. daily for hypertension.  8. Propranolol 30 mg p.o. twice a day. 9. Coumadin 4.5 mg p.o. at bedtime, with this to be started 12/21/2011.  10. Norco 5/325 mg, 1 p.o. every four hours p.r.n. severe pain.  11. Dicloxacillin 250 mg p.o. four times a day x8 days.  12. Omeprazole 20 mg p.o. daily for gastric protection in this patient on aspirin and Coumadin.   CODE STATUS: During this hospitalization she was DO NOT RESUSCITATE. An out of facility DNR order will be sent with her to the nursing facility.  TIME SPENT ON DISCHARGE:  45 minutes. ____________________________ Adin Hector, MD bjk:slb D: 12/20/2011 07:35:59 ET T: 12/20/2011 07:57:57 ET JOB#: [id number]  cc: Tama High III, MD, <Dictator> Jerene Bears, MD Ramonita Lab MD ELECTRONICALLY SIGNED  12/24/2011 7:38

## 2015-04-10 NOTE — Consult Note (Signed)
PATIENT NAME:  [name], [name] MR#:  [id number] DATE OF BIRTH:  Jul 17, 1928  DATE OF CONSULTATION:  12/17/2011  REFERRING PHYSICIAN:   CONSULTING PHYSICIAN:  Adella HareJ. Wilton Vaudie Engebretsen, MD  HISTORY OF PRESENT ILLNESS: This 81 year old female came in the hospital for treatment of parotiditis. Chief complaint for purposes of surgery consultation is a clogged gastric feeding tube with a previous diagnosis of stroke with dysphagia. She had had [place] feeding [place] in [place] some 19 days ago and the nurse found today that the feeding tube is clogged up. I read Dr. Earnest ConroyElliott's note that he had inserted a wire into the tube trying to get it unstopped but the wire went down to about an inch from the skin and would not pass beyond that point. The nurses have tried to irrigate it multiple times trying to force fluid down the catheter.   PAST MEDICAL HISTORY:  1. She had a stroke recently which paralyzed her right arm and right leg and also developed dysphagia, had the feeding tube inserted, later has been transferred to a skilled nursing facility.  2. Atrial fibrillation. 3. Hyperlipidemia. 4. Hypertension. 5. Irritable bowel disease.   PAST SURGICAL HISTORY:  1. Cataract surgery. 2. Cholecystectomy. 3. Hysterectomy. 4. Tonsillectomy.   MEDICATIONS:  1. Amantadine. 2. Aspirin. 3. Atorvastatin.  4. Cholestyramine. 5. Cymbalta. 6. Hydrochlorothiazide/triamterene. 7. Coumadin. 8. Losartan. 9. Mapap.  10. Mirtazapine. 11. Propranolol. 12. Tylenol.  REVIEW OF SYSTEMS: Patient has multiple questions but nods her head no and does not seem to understand the questions.   PHYSICAL EXAMINATION:  GENERAL: She is awake and alert, says very little.   HEENT: Pupils equal and reactive to light. Extraocular movements are intact. Pharynx is with some mucus.   LUNGS: Lungs sounds with shallow breath sounds are clear.   HEART: Irregular rhythm S1 and S2.   ABDOMEN: Obese and soft, nontender.    LABORATORY, DIAGNOSTIC AND RADIOLOGICAL DATA: The gastrostomy tube was seen in the left upper quadrant. It appears to be a clear tube and it does appear to be some sediment in the tube just above the skin level. I inserted a Foley catheter guidewire with use of K-Y jelly and advanced this down to the sediment within the tube and applied pressure with simultaneous traction on the tube and was unable to relieve the obstruction after a significant attempt and also found that I could not flush it with water.   Also noted her lab work; latest creatinine is 0.53. Her latest white count 13,100, hemoglobin 10.7 and latest pro time was 22.8 seconds.   IMPRESSION:  1. Dysphagia secondary to stroke. 2. Obstructed feeding tube.   PLAN: I discussed this with Dr. Marva PandaSkulskie and also with the Mr. Losee, her husband. I think with the fact it has only been in for 19 days it would not be prudent to remove this tube at the bedside in anticipation of inserting another tube. I think we will need to have her go to endoscopy suite and actually repeat the procedure that had been done before with percutaneous endoscopic gastrostomy and that will anticipate putting in new T-fasteners and also attempt to put in a larger sized tube, hopefully an 18-gauge tube, which is less likely to become obstructed.   Did discuss this with the patient along with the risks and benefits and will make arrangements through Dr. Marva PandaSkulskie for insertion of the new tube.   ____________________________ [name], MD jws:cms D: 12/17/2011 19:30:06 ET T: 12/19/2011 08:41:12 ET JOB#:  [id number]  cc: Adella Hare, MD, <Dictator> Adella Hare MD ELECTRONICALLY SIGNED 01/16/2012 18:13

## 2015-04-10 NOTE — Consult Note (Signed)
Chief Complaint:   Subjective/Chief Complaint patietn seen and examined, chart reviewed. patient says less facial pain.  no n/v or abd pain.  She ate a full liquid diet this am without difficulty.   VITAL SIGNS/ANCILLARY NOTES: **Vital Signs.:   01-Jan-13 06:05   Vital Signs Type Routine   Temperature Temperature (F) 97.6   Celsius 36.4   Temperature Source axillary   Pulse Pulse 70   Pulse source per Dinamap   Respirations Respirations 18   Systolic BP Systolic BP 139   Diastolic BP (mmHg) Diastolic BP (mmHg) 78   Mean BP 98   BP Source Dinamap   Pulse Ox % Pulse Ox % 94   Pulse Ox Activity Level  At rest   Oxygen Delivery Room Air/ 21 %   Brief Assessment:   Cardiac Irregular    Respiratory clear BS    Gastrointestinal details normal Soft  Nontender  Nondistended  No masses palpable  Bowel sounds normal   Routine Hem:  01-Jan-13 00:44    WBC (CBC) -   WBC (CBC) 13.4   RBC (CBC) 3.75   Hemoglobin (CBC) 10.7   Hematocrit (CBC) 32.0   Platelet Count (CBC) 326   MCV 85   MCH 28.6   MCHC 33.6   RDW 14.2  Routine Coag:  01-Jan-13 00:44    Prothrombin 24.3   INR 2.2  Routine Hem:  01-Jan-13 00:44    Neutrophil % 69.4   Lymphocyte % 18.3   Monocyte % 9.0   Eosinophil % 2.7   Basophil % 0.6   Neutrophil # 9.3   Lymphocyte # 2.4   Monocyte # 1.2   Eosinophil # 0.4   Basophil # 0.1   Assessment/Plan:  Assessment/Plan:   Assessment 1) s/p CVA 11/19/11.  history of AF, currently on coumadin, with INR at 2.2. 2) clogged feeding tube. Question of possible replacement with a larger tube.  Patient tolerated full liquids this am and apparently ate without difficulty this am. It seems that as the facial abscess improves the difficulty swallowint is also improving.    Plan 1) discussed with Dr Dan HumphreysWalker and Dr Katrinka BlazingSmith. Will hold plans for changing feeding tube for now as it may actually not be needed. Recommend a bedside swallowing study versus MBSS in the next couple days.    Electronic Signatures: Barnetta ChapelSkulskie, Martin (MD)  (Signed 01-Jan-13 09:34)  Authored: Chief Complaint, VITAL SIGNS/ANCILLARY NOTES, Brief Assessment, Lab Results, Assessment/Plan   Last Updated: 01-Jan-13 09:34 by Barnetta ChapelSkulskie, Martin (MD)

## 2015-04-10 NOTE — Consult Note (Signed)
Referring Physician:  Adin Hector   Primary Care Physician:  [facility], [address] [place], [place] [address], [place] [phone]  Reason for Consult:  Admit Date: 02-Jun-2012   Chief Complaint: AMS   Reason for Consult: altered mental status   History of Present Illness:  History of Present Illness:   81 yo RHD F presents to [facility] secondary to acute onset of altered mental status.  Per husband, pt was in assisted living facility but still able to ambulate and feed herself.  Pt  could not cook or dress herself however and all of this started since she had a stroke on 12/12.  He notes that all of a sudden that she became more confused.  Prior to this, there was note of R knee pain of unknown origin that they thought was neurologic in origin so she tried baclofen which gave her some type of reaction that he was not able to explain so it was stopped.  However, there was no confusion until the other day when he bought her in.  Per husband, cognitively she was functioning quite well from this large stroke and able to communicate without problems.  Since being here, husband notes that she has been hallucinating and not able to tell us anything about herself which is a lot different for her.  He also noted that she stretched her R leg out for the first time when things were the worse with hallucinations.  ROS:   Review of Systems   unable to obtain secondary to mental status yet pt does complain of diffuse pain  Past Medical/Surgical Hx:  CVA/Stroke:   Irritable Bowel Syndrome:   HTN:   Atrial Fibrillation:   Cataract Extraction: RIGHT  Cholecystectomy:   C-Section:   Hysterectomy - Total:   Tonsillectomy:   Past Medical/ Surgical Hx:   Past Medical History L MCA stroke    Past Surgical History PEG placement   Home Medications: Medication Instructions Last Modified Date/Time  propranolol 20 mg oral tablet 1.5 tabs (25m) via tube 2 times a day  (8am, 5pm) for htn. 13-Jun-13 02:32  amantadine 100 mg oral capsule 1 cap(s)via tube once a day at 8am. 13-Jun-13 02:32  aspirin 81 mg oral tablet, chewable 2 tabs (1662m via tube once a day at 8am for anticoagulation. 13-Jun-13 02:32  Cymbalta 60 mg oral delayed release capsule 1 cap(s) via tube once a day at 8am. 13-Jun-13 02:32  Flomax 0.4 mg oral capsule cap(s) orally once a day (at bedtime) 13-Jun-13 02:32  Lipitor 40 mg oral tablet 1 tab(s) orally once a day (at bedtime) 13-Jun-13 02:32  Norco 5 mg-325 mg oral tablet tab(s) orally every 4 hours, As Needed- for Pain  13-Jun-13 02:32  omeprazole 20 mg oral delayed release capsule 1 cap(s) orally once a day 13-Jun-13 02:32  Coumadin 4 mg oral tablet 3 milligram(s) orally once a day (at bedtime) 13-Jun-13 02:32  Senna Plus 50 mg-8.6 mg oral tablet 2 tab(s) orally once a day (at bedtime) 13-Jun-13 02:32  Triple Antibiotic topical ointment Apply topically to affected area once a day 13-Jun-13 02:32  hydrOXYzine hydrochloride 25 mg oral tablet 1 tab(s) orally every 4 hours, As Needed 13-Jun-13 02:32  Milk of Magnesia 8% oral suspension 15 milliliter(s) orally 2 times a day, As Needed- for Constipation  13-Jun-13 02:32  MiraLax oral powder for reconstitution 17 gram(s) orally once a day (at bedtime), As Needed- for Constipation  13-Jun-13 02:32  promethazine 25 mg oral tablet 1 suppository(ies) rectal every 4 hours 13-Jun-13 02:32  Tylenol 325 mg oral tablet 2 tab(s) orally every 4 hours, As Needed- for Fever  13-Jun-13 02:32  Voltaren Topical Apply topically to affected area 4 times a day 13-Jun-13 02:32  CeraVe topical cream Apply topically to affected area once a day 13-Jun-13 02:32  Diaper Rash Ointment  rectal  13-Jun-13 02:23  Fleet Enema  orally  13-Jun-13 02:25  Flector Patch 1.3% topical film, extended release Apply topically to affected area 2 times a day 13-Jun-13 02:32  Skelaxin 800 mg oral tablet 1 tab(s) orally 3 times a day, As  Needed- for Pain  13-Jun-13 02:32  PAIN RELIEF 650MG CR 2 tab(s) orally 2 times a day, As Needed- for Pain  13-Jun-13 02:32   Allergies:  Benadryl: Agitation  Macrobid: Other  Social/Family History:  Employment Status: retired   Lives With: alone   Living Arrangements: assisted living   Social History: no tob, no EtOH, no illicits   Family History: n/c   Vital Signs: **Vital Signs.:   17-Jun-13 10:51   Temperature Temperature (F) 98.5   Celsius 36.9   Temperature Source axillary   Pulse Pulse 81   Respirations Respirations 20   Systolic BP Systolic BP 159   Diastolic BP (mmHg) Diastolic BP (mmHg) 78   Mean BP 94   Pulse Ox % Pulse Ox % 96   Pulse Ox Activity Level  At rest   Oxygen Delivery Room Air/ 21 %   Physical Exam:  General: alert, mild distress, normal weight   HEENT: normocephalic, sclera nonicteric, oropharynx clear   Neck: supple, no JVD, no bruits   Chest: CTA B, no wheezes, no murmurs   Cardiac: irregularly irregular, no murmurs, edema on R UE and LE   Extremities: 2+ edema on R UE and LE, no cyanosis, clonis, moderate contracture of R arm and R leg   Neurologic Exam:  Mental Status: alert but not oriented at all, unable to name and only intermittently follows, moderate dysarthria, echolia noted;  has blank stare on face   Cranial Nerves: PERRLA, EOMI somewhat but will not look completely to L, nl VF to threat, face symmetric, tongue midline, shoulder shrug equal   Motor Exam: 4/5 on L, 1/5 on R with increased tone, there is mild to moderate spascity but there is also a willful component where pt resists passive movement, no tremor   Deep Tendon Reflexes: more brisk 2/4 on R, 1/4 on L, Babinski on R, downgoing on L   Sensory Exam: inconsistent, but pt does note pain with just touching R leg   Coordination: untestable   Lab Results: Hepatic:  13-Jun-13 01:13    Bilirubin, Total  1.3   Alkaline Phosphatase 105   SGPT (ALT) 19 (12-78 NOTE: NEW REFERENCE  RANGE 11/09/2011)   SGOT (AST) 31   Total Protein, Serum 6.7   Albumin, Serum  2.5  LabUnknown:  13-Jun-13 02:49    CEFOXITIN S  Routine BB:  13-Jun-13 03:18    Fresh Frozen Plasma Unit 2 Transfused  Result(s) reported on 30 May 2012 at 03:30PM.   ABO Group + Rh Type A Positive   Antibody Screen NEGATIVE (Result(s) reported on 29 May 2012 at 06:23AM.)   Crossmatch Unit 1 Transfused   Crossmatch Unit 2 Transfused  Result(s) reported on 29 May 2012 at 09:00PM.    22:54    Fresh Frozen Plasma Unit 1 -  Routine Micro:  13-Jun-13  02:49    Organism Name ESCHERICHIA COLI   Organism Quantity >100,000 CFU/ML   Nitrofurantoin Sensitivity S   Cefazolin Sensitivity R   Ampicillin Sensitivity R   Ceftriaxone Sensitivity S   Ciprofloxacin Sensitivity S   Gentamicin Sensitivity S   Imipenem Sensitivity S   Trimethoprim/Sulfamethoxazole Sensitivty S   Lefofloxacin Sensitivity S   Specimen Source CLEAN CATCH   Organism 1 >100,000 CFU/ML ESCHERICHIA COLI  Result(s) reported on 31 May 2012 at 07:51AM.    08:11    Micro Text Report BLOOD CULTURE   COMMENT                   NO GROWTH IN 48 HOURS   ANTIBIOTIC                        Culture Comment NO GROWTH IN 48 HOURS  Result(s) reported on 31 May 2012 at 06:18AM.  Routine Chem:  13-Jun-13 22:54    Result Comment ffp - duplicate order  Result(s) reported on 30 May 2012 at 07:29AM.  17-Jun-13 04:50    Glucose, Serum  110   BUN 7   Creatinine (comp)  0.49   Sodium, Serum 144   Potassium, Serum 4.1   Chloride, Serum  108   CO2, Serum 29   Calcium (Total), Serum  8.1   Anion Gap 7   Osmolality (calc) 285   eGFR (African American) >60   eGFR (Non-African American) >60 (eGFR values <87mL/min/1.73 m2 may be an indication of chronic kidney disease (CKD). Calculated eGFR is useful in patients with stable renal function. The eGFR calculation will not be reliable in acutely ill patients when serum creatinine is changing rapidly. It is  not useful in  patients on dialysis. The eGFR calculation may not be applicable to patients at the low and high extremes of body sizes, pregnant women, and vegetarians.)  Cardiac:  13-Jun-13 22:22    Troponin I 0.05 (0.00-0.05 0.05 ng/mL or less: NEGATIVE  Repeat testing in 3-6 hrs  if clinically indicated. >0.05 ng/mL: POTENTIAL  MYOCARDIAL INJURY. Repeat  testing in 3-6 hrs if  clinically indicated. NOTE: An increase or decrease  of 30% or more on serial  testing suggests a  clinically important change)  Routine UA:  13-Jun-13 02:49    Color (UA) Amber   Clarity (UA) Cloudy   Glucose (UA) Negative   Bilirubin (UA) Negative   Ketones (UA) Negative   Specific Gravity (UA) 1.024   Blood (UA) Negative   pH (UA) 5.0   Protein (UA) 30 mg/dL   Nitrite (UA) Positive   Leukocyte Esterase (UA) 3+ (Result(s) reported on 29 May 2012 at 03:18AM.)   RBC (UA) 1 /HPF   WBC (UA) 198 /HPF   Bacteria (UA) 3+   Epithelial Cells (UA) <1 /HPF   WBC Clump (UA) PRESENT   Mucous (UA) PRESENT   Hyaline Cast (UA) 1 /LPF (Result(s) reported on 29 May 2012 at 03:18AM.)  Routine Sero:  13-Jun-13 19:58    Occult Blood, Feces NEGATIVE (Result(s) reported on 29 May 2012 at 08:27PM.)  Routine Coag:  16-Jun-13 04:33    Prothrombin 14.3   INR 1.1 (INR reference interval applies to patients on anticoagulant therapy. A single INR therapeutic range for coumarins is not optimal for all indications; however, the suggested range for most indications is 2.0 - 3.0. Exceptions to the INR Reference Range may include: Prosthetic heart valves, acute myocardial infarction, prevention of myocardial  infarction, and combinations of aspirin and anticoagulant. The need for a higher or lower target INR must be assessed individually. Reference: The Pharmacology and Management of the Vitamin K  antagonists: the seventh ACCP Conference on Antithrombotic and Thrombolytic Therapy. PPIRJ.1884 Sept:126 (3suppl):  [id number]. A HCT value >55% may artifactually increase the PT.  In one study,  the increase was an average of 25%. Reference:  "Effect on Routine and Special Coagulation Testing Values of Citrate Anticoagulant Adjustment in Patients with High HCT Values." American Journal of Clinical Pathology 2006;126:400-405.)  Routine Hem:  17-Jun-13 04:50    Manual Diff MANUAL DIFF DONE  Result(s) reported on 02 Jun 2012 at 08:30AM.   WBC (CBC)  14.2   RBC (CBC)  3.27   Hemoglobin (CBC)  8.6   Hematocrit (CBC)  27.3   Platelet Count (CBC) 438   MCV 84   MCH 26.4   MCHC  31.6   RDW  18.1   Neutrophil % 67.1   Lymphocyte % 19.1   Monocyte % 8.0   Eosinophil % 5.3   Basophil % 0.5   Neutrophil #  9.5   Lymphocyte # 2.7   Monocyte #  1.1   Eosinophil #  0.8   Basophil # 0.1 (Result(s) reported on 02 Jun 2012 at 05:31AM.)   Segmented Neutrophils 67   Lymphocytes 19   Monocytes 3   Eosinophil 11   Diff Comment 1 PLTS VARIED IN SIZE   Diff Comment 2 LARGE PLATELETS   Diff Comment 3 MICROCYTES PRESENT   Diff Comment 4 ANISOCYTOSIS   Diff Comment 5 POLYCHROMASIA  Result(s) reported on 02 Jun 2012 at 05:31AM.   Radiology Results: CT:    13-Jun-13 04:05, CT Head Without Contrast   CT Head Without Contrast    REASON FOR EXAM:    altered mental status, h/o cva  COMMENTS:       PROCEDURE: CT  - CT HEAD WITHOUT CONTRAST  - May 29 2012  4:05AM     RESULT: Comparison is made to prior study dated 11/19/2011.    Technique: Helical noncontrasted 5 mm sections wereobtained from the   skull base to the vertex.     Findings: There is diffuse cortical atrophy and findings consistent with   chronic MCA distribution infarction on the left. There is lateral   ventricular compensatory enlargement. A linearly shaped area of low   attenuation projects in the periventricular region. There is no evidence   of acute hemorrhage or further evidence of intra-axial nor extra-axial     fluid collections.  There is no evidence of mass effect nor a depressed   skull fracture.    Basal ganglia calcifications are identified.    IMPRESSION:      1. Chronic and involutional changes without evidence of acute   abnormalities.   2. Dr. Jasmine December of the Emergency Department was informed of these   findings via a preliminary faxed report.       Thank you for the opportunity to contribute to the care of your patient.       Verified By: Mikki Santee, M.D., MD    15-Jun-13 10:27, CT Head Without Contrast   CT Head Without Contrast    REASON FOR EXAM:    AMS  COMMENTS:       PROCEDURE: CT  - CT HEAD WITHOUT CONTRAST  - May 31 2012 10:27AM     RESULT: Axial CT scanning was performed through the brain with  reconstructions at 5 mm intervals and slice thicknesses. Comparison is   made to a previous study dated 29 May 2012.    There is stable ex vacuo dilation of the left lateral ventricle. There is   encephalomalacia involving the insula and portions of the basal ganglia.   There is no shift of the midline. There is no acute intracranial   hemorrhage. I do not see evidence of acute ischemic changes. There is   punctate basal ganglia calcification on the left which is stable. The   cerebellum is normal in density. There is subtle stable hypodensity in     the left cerebral peduncle. This is new since December 2012 but was   visible on the 13 June study.    At bone window settings the observed portions of the paranasal sinuses   and mastoid air cells are clear. There is no evidence of an acute skull   fracture.    IMPRESSION:    1. There are chronic post CVA changes within the left cerebral hemisphere   which are stable.   2. There is hypodensity in the left cerebral peduncle which was present   on 29 May 2012. This is new since December 2012 and may reflect a   previous ischemic insult.  3. There is no evidence of acute intracranial hemorrhage.     Dictation Site:  5          Verified By: DAVID A. Martinique, M.D., MD   Impression/Recommendations:  Recommendations:   labs reviewed and remarkable for leukocytosis with moderate UTI a few days ago of head personally reviewed by me and shows large encephalomalacia on L MCA territory where previous infarct was, moderate atrophy, no hemorrhage was discussed with Dr. Caryl Comes    Altered mental status-  this can be multifactorial but the biggest concern is for nonconvulsive seizures.  Reasons being that pt has an old stroke which is a seizure focus, pt has been placed on multiple medications (baclofen, fluoroquinilones) that can lower seizure threshold, pt has an urinary tract infection which can lower seizure threshold, and the clinical history of starting and not responding.  Also on the differential include worsening of old stroke by UTI, a new stroke, encephalopathy or even a possible CNS infection which is the lowest. R knee pain-  this is almost impossible to address in the setting of altered mental status but could be Dejerine-Roussy (thalamic pain syndrome) due to old contralateral stroke but the onset was a little late. L MCA infarct-  old and stable, maybe causing 2. but could also cause 1.;  etiology cardioembolic from atrial fibrillation Atrial fibrillation-  rate controlled, not anticoagulated now MRI of brain w/wo contrast to r/o new infarct EEG to look for encephalopathy and seizure like activity d/c flouroquinilones and baclofen would not use narcotics in this patient either load Keppra 1gm IV then $Remov'750mg'zOAlsH$  BID IV give one time dose Ativan $RemoveBef'1mg'tjbpmDSdIO$  IV x 1 check B12/folate, TSH, ESR, CRP continue Bactrim agree with holding anticoagulation in this setting but would start full dose ASA $Remove'325mg'PNvxIYm$  also poor PO intake so will start supplemental fluids if the above test are negative, will perform LP on this patient thank you for the consult, we will continue to follow patient with you  Electronic Signatures: Jamison Neighbor  (MD)  (Signed 17-Jun-13 13:57)  Authored: REFERRING PHYSICIAN, Primary Care Physician, Consult, History of Present Illness, Review of Systems, PAST MEDICAL/SURGICAL HISTORY, HOME MEDICATIONS, ALLERGIES, Social/Family History, NURSING VITAL SIGNS, Physical Exam-,  LAB RESULTS, RADIOLOGY RESULTS, Recommendations   Last Updated: 17-Jun-13 13:57 by Jamison Neighbor (MD)

## 2015-04-10 NOTE — Consult Note (Signed)
Brief Consult Note: Diagnosis: Flexion contractures of right knee and hip.   Patient was seen by consultant.   Consult note dictated.   Comments: No erythema, knee effusion, or increased warmth to right knee. Significant flexion contracture. No gross evidence of infection. Radiographs demonstrated diffuse osteopenia but no evidence of fracture or osteomyelitis.  Patient with history of left parotitis and cellulitis secondary to MRSA in 12/2011. I have asked the nurses to place the patient on contact precautions.  Electronic Signatures: Donato HeinzHooten, James P (MD)  (Signed 18-Jun-13 20:38)  Authored: Brief Consult Note   Last Updated: 18-Jun-13 20:38 by Donato HeinzHooten, James P (MD)

## 2015-04-10 NOTE — Consult Note (Signed)
PATIENT NAME:  [name] [name], [name] MR#:  [id number] DATE OF BIRTH:  09/15/1928  DATE OF CONSULTATION:  06/03/2012  REFERRING PHYSICIAN:  Dr. Daniel NonesBert Klein  CONSULTING PHYSICIAN:  Illene LabradorJames P. Angie FavaHooten Jr., MD  CHIEF COMPLAINT: Right hip and knee pain.   HISTORY OF PRESENT ILLNESS: The patient is an 81 year old female with severe dementia who was admitted on 05/29/2012 with a left groin hematoma and a hemoglobin of less than 6.5. She was noted be confused at the time. The patient has a history of cerebrovascular accident with right-sided hemiplegia. Family was not available for questioning and most of the information comes from the current medical record. Patient has had elevated white count as well as elevated sedimentation rate and C-reactive protein. She is extremely confused and unable to localize any pain at this time.   PAST MEDICAL HISTORY:  1. Irritable bowel syndrome.  2. Urinary tract infection. 3. Hypertension. 4. Chronic atrial fibrillation. 5. Dyslipidemia. 6. Cerebrovascular accident with right-sided hemiplegia. 7. Cholecystectomy.  8. Hysterectomy.  9. Tonsillectomy. 10. Cataract extraction.   ALLERGIES: Macrobid and diphenhydramine.   CURRENT MEDICATIONS:  1. Norco 1 to 2 tablets every four hours p.r.n. pain. 2. Zofran 4 mg IV every four hours p.r.n. nausea. 3. Protonix 40 mg IV every 12 hours.  4. Metoprolol 25 mg b.i.d.  5. Diclofenac gel 1% apply to the knee p.r.n.  6. Keppra 750 mg IV every 12 hours.  7. Bactrim DS 1 tablet every 12 hours.  8. Vitamin B12 1000 mcg IM daily.  9. Magnesium oxide 4 mg daily.   SOCIAL HISTORY: [facility]. No current tobacco use or alcohol use.   FAMILY HISTORY: Positive for hypertension.   REVIEW OF SYSTEMS: Unable to obtain due to patient's dementia.   PHYSICAL EXAMINATION:  GENERAL: The patient is an elderly appearing female who is confused and disoriented. She is lying in the bed with the right hip and knee  hyperflexed and the hip crossing over the contralateral leg with pillow between the legs.   RIGHT LOWER EXTREMITY: Significant flexion contracture is noted to the right hip and right knee. Patient resists any attempt at passive range of motion of either the hip or knee but similar protests arise with attempts at passive range of motion of the foot and ankle. Examination of the knee shows no gross evidence of erythema, increased warmth, or effusion. The skin is intact. Attempt was made to visualize the popliteal region and no evidence of skin breakdown was appreciated in that area. Examination of the hip demonstrated relative spasticity with inability to passively extend the hip. Mild swelling was noted to the thigh.   NEUROLOGIC: The patient is awake and alert but disoriented. Contracture to the right lower extremity is appreciated with some edema and spasticity to the upper extremity. Good active range of motion of the left upper and lower extremities is appreciated.   LABORATORY, DIAGNOSTIC AND RADIOLOGICAL DATA: [facility]. Diffuse osteopenia is appreciated. No gross evidence of fracture. No radiographic evidence of osteomyelitis.   IMPRESSION: Right hip and knee flexion contractures secondary to CVA with right-sided hemiplegia.   PLAN: I do not see any gross clinical evidence to suggest infection to the knee. Unable to appreciate significant effusion and therefore no attempt was made at aspiration of the knee. Patient does have a history of left-sided parotitis and cellulitis secondary to methicillin-resistant staph aureus in 12/2011. I have requested to place  the patient on contact precautions due to the MRSA infection. I would not recommend any other intervention at the present time. The skin appears to be in good condition without gross evidence of breakdown.   ____________________________ Illene Labrador. Angie Fava., MD jph:cms D: 06/03/2012  20:50:01 ET T: 06/04/2012 09:43:37 ET JOB#: [id number]  cc: Fayrene Fearing P. Angie Fava., MD, <Dictator> Illene Labrador Angie Fava MD ELECTRONICALLY SIGNED 06/06/2012 12:44

## 2015-04-10 NOTE — H&P (Signed)
PATIENT NAMGaspar Cola:  Kurkowski, Douglas MR#:  [id number] DATE OF BIRTH:  Feb 24, 1928  DATE OF ADMISSION:  05/29/2012  PRIMARY CARE PHYSICIAN: Dr. Daniel NonesBert Klein  REFERRING PHYSICIAN: Dr. Burman RiisWoodrow  CHIEF COMPLAINT: Anemia, confusion.   SUBJECTIVE: This is an 81 year old female with a history of advanced dementia, history of chronic atrial fibrillation on anticoagulation with a recent supratherapeutic INR of 5.1 in June, today the INR is therapeutic, who presents with a left groin hematoma and a hemoglobin of less than 6.5. The patient was also found to be more confused and was sent to the ED for further evaluation. In the ER she was found to have a urinary tract infection. The patient is on Coumadin for her atrial fibrillation. The patient had a recent CVA, dysphagia, required starting PEG tube feedings. Her PEG tube has since been discontinued and the patient has been placed on a p.o. diet. The patient is agitated and keeps repeating the same words that she wants to go home. She is disoriented to time, place, and person, is not able to provide me with any meaningful history and most of the history is complied from the ED notes and the nursing home notes. The patient otherwise was found to be hemodynamically stable. She is, however, noted to be contracted in her right lower extremity and is complaining of pain in her left groin. It is unclear if the patient fell at the nursing home or whether the bleed is spontaneous.   The patient was noted to have a negative Hemoccult in the ER.   PAST MEDICAL HISTORY:  1. History of irritable bowel syndrome.  2. Urinary tract infection. 3. Hypertension.   4. Chronic atrial fibrillation.  5. Dyslipidemia.  6. History of CVA, transient ischemic attack, status post PEG tube placement, now removed.   PAST SURGICAL HISTORY:  1. Cholecystectomy.  2. Hysterectomy. 3. Tonsillectomy. 4. Cataract extraction.   ALLERGIES: Macrobid and diphenhydramine.   HOME MEDICATIONS:   1. Omeprazole 20 mg p.o. daily.  2. Tylenol 650 p.o. p.r.n. pain. 3. Propranolol 20 mg p.o. twice a day. 4. Senokot 1 tablet p.o. daily. 5. Tamsulosin 0.4 mg at bedtime for retention.  6. Coumadin per pharmacy. 7. Flector patch to the right knee.  8. Celexa 800 mg t.i.d. p.r.n. for pain in the right knee.  9. Motrin gel 4 grams q.i.d. to right knee.  10. Coumadin 3 mg p.o. daily.  11. Amantadine 100 mg two twice a day  .  12. Aspirin 81 mg p.o. daily.  13. Atorvastatin 40 mg at bedtime.  14. Cymbalta 60 mg daily. 15. Hydrocodone 5/325, 1 tablet q.4 p.r.n.  16. Hydroxyzine 25 mg q.4 p.r.n.  17. MiraLax 17 grams p.o. daily.  18. Promethazine 25 mg suppository q.4-6 hours as needed.   SOCIAL HISTORY: Nonsmoker, nonalcoholic. [facility].    FAMILY HISTORY: Positive for hypertension. No history of stroke or smoking.   REVIEW OF SYSTEMS: CONSTITUTIONAL: No fever, fatigue, weakness, pain, weight loss. EYES, RESPIRATORY, CARDIOVASCULAR, GASTROINTESTINAL, GENITOURINARY, ENDOCRINE, HEMATOLOGY, INTEGUMENTARY, MENTAL STATUS, NEUROLOGICAL, PSYCH. An attempt was made to complete these review of systems but unable to be obtained from the patient because of her significant confusion, disorientation as documented in the history of present illness.   PHYSICAL EXAMINATION:  VITAL SIGNS: Blood pressure 111/56, respirations 18, pulse 85, 97% on room air.   GENERAL: Currently comfortable, in no acute cardiopulmonary distress, found to be disoriented to time, place, and person, appears to be debilitated, elderly.   HEENT:  Head atraumatic, normocephalic. Pupils equal and reactive to light. Extraocular movements are intact. Oropharynx is very dry. Nasal exam shows no drainage, ulceration. Ear exam shows no drainage, ulceration.   NECK: No thyromegaly. The patient has no oral lesions at this time. No thyromegaly. No carotid bruit.   CARDIOVASCULAR: Irregularly irregular. No murmurs, rubs, or  gallops noted. PMI is nondisplaced.   LUNGS: Clear to auscultation bilaterally without any rales, rhonchi, wheezing.   ABDOMEN: Soft, nontender, nondistended, normoactive bowel sounds. PEG tube removed.   EXTREMITIES: Without cyanosis, clubbing, or edema.   NEUROLOGIC: Patient is contracted in the right lower extremity, flaccid in the right upper and lower extremity. Deep tendon reflexes appear to be intact.   MUSCULOSKELETAL: Left groin hematoma extending from the groin area all the way to the inner aspect of the thigh above the knee.   SKIN: Groin hematoma as noted above, otherwise, no areas of skin breakdown.   LYMPHATICS: No axillary, inguinal, cervical lymphadenopathy.   VASCULAR: Distal pulses 2+ bilaterally.   PSYCHIATRIC: Confused.   LABORATORY, DIAGNOSTIC AND RADIOLOGICAL DATA: Glucose 118, BUN 21, creatinine 0.59, sodium 138, potassium 3.1, chloride 99, bicarbonate 28, total bilirubin 1.3, WBC 16.2, hemoglobin 6.3, hematocrit 19.8, platelet count 495. INR 2.7. Urinalysis shows 3+ leukocyte esterase, positive for nitrite.   Doppler of bilateral lower extremities shows no evidence of deep vein thrombosis.  Chest x-ray shows no obvious infiltrate.   ASSESSMENT AND PLAN:  1. Profound anemia, probably related to the left groin hematoma which seems to be a spontaneously developing hematoma without any history of trauma or fall. Monitor serial CBCs. The patient will be transfused with FFP, Coumadin will be held, patient will also be transfused with 2 units of packed red blood cells.  2. Atrial fibrillation. She is on propranolol for rate control which will be resumed if her blood pressure is okay after the transfusion. The patient's Coumadin will be held.  3. Recent history of CVA. Hold aspirin and Coumadin for now.  4. Hypertension. Resume propranolol when able.  5. Dyslipidemia, stable.  6. Urinary tract infection. A urine culture has been sent. Previous cultures have been  positive for Candida albicans. Start the patient on Rocephin.  7. She is a DO NOT RESUSCITATE, DO NOT INTUBATE.   TIME SPENT: About 1-1/2 hours was spent in doing this admission.  ____________________________ Richarda Overlie, MD na:cms D: 05/29/2012 05:57:02 ET T: 05/29/2012 06:32:15 ET JOB#: [id number]  cc: Richarda Overlie, MD, <Dictator> Lynnea Ferrier, MD Richarda Overlie MD ELECTRONICALLY SIGNED 05/30/2012 0:59

## 2015-04-10 NOTE — Discharge Summary (Signed)
PATIENT NAMGaspar Cola:  [name], [name] DATE OF BIRTH:  04-Oct-1928  DATE OF ADMISSION:  05/29/2012 DATE OF DISCHARGE:  06/13/2012  DISCHARGE DIAGNOSES:   1. Left leg and thigh hematoma.  2. Acute blood loss anemia secondary to #1.  3. Urinary tract infection.  4. Atrial fibrillation.  5. History of cerebrovascular accident with right-sided weakness and expressive and receptive aphasia.  6. Osteoarthritis.   HISTORY AND PHYSICAL: Please see dictated admission History and Physical.   HOSPITAL COURSE: The patient was admitted after a finding of a left thigh hematoma. She was taken off Coumadin and this was reversed. She was transfused. Her hemoglobin remained stable. She was found to have a urinary tract infection and completed a course of antibiotic therapy. Unfortunately, she also had significant metabolic encephalopathy, initially thought to be secondary to the above issues. Her mental status would not improve despite treatment of the above. Neurology saw the patient, and she underwent extensive evaluation which included MRI and lumbar puncture, which were unrevealing. She has evidence of chronic small vessel disease diffusely, and there is some evidence of a subacute cerebrovascular accident between the imaging obtained in December and the imaging here, although this did not appear to be acute related to this admission. She complained of significant pain in her right knee at times, although her language deficits made this challenging. She underwent orthopedic evaluation with multiple films which revealed no evidence of fracture. She seemed to improve slightly with prednisone in terms of pain but continued to have confusion. Her oral intake remained very poor. She was tried on mirtazapine and prednisone to try to help with this, unfortunately still not much improvement. Dietary saw the patient and further altered her diet, including taking her off of her thickened liquids and pureed foods in  hopes of increasing her oral intake, but still very little help in improving this.   Extensive conversations were held with her husband and her sons, with the help of Care Management and Palliative Care. Given her lack of improvement, and given the overall poor quality of her life since December 2012 when she had previous cerebrovascular accident, it was felt that COMFORT MEASURES would be appropriate. They wish to pursue this as well as Hospice Home. A Hospice Home bed became available, and the patient will be discharged to Hospice Home in stable condition with prognosis poor.   DIET: As tolerated.   ACTIVITY: As tolerated.   DISCHARGE MEDICATIONS: Her medications may be crushed or liquid may be used when appropriate.  1. Roxanol 20 mg/mL, 5 mg p.o. every 2 to 4 hours p.r.n. severe pain.  2. Ativan 0.5 to 1 mg p.o. or sublingually every 2 to 4 hours p.r.n. agitation or anxiety.  3. Zantac 150 mg p.o. b.i.d.  4. ABHR suppository, 1 rectally every 4 to 6 hours p.r.n. nausea and vomiting.   CODE STATUS: The patient was DO NOT RESUSCITATE, and an Out-of-Facility  DO NOT RESUSCITATE order was sent with her to the facility.   ____________________________ Lynnea FerrierBert J. Klein III, MD bjk:cbb D: 06/13/2012 12:23:12 ET T: 06/13/2012 12:42:47 ET JOB#: [id number]  cc: Curtis SitesBert J. Klein III, MD, <Dictator> Daniel NonesBERT KLEIN MD ELECTRONICALLY SIGNED 06/23/2012 13:22

## 2015-04-10 NOTE — H&P (Signed)
PATIENT NAME:  [name], [name] MR#:  [id number] DATE OF BIRTH:  01/15/1928  DATE OF ADMISSION:  12/13/2011  PRIMARY CARE PHYSICIAN: Dr. Graciela HusbandsKlein  ER REFERRING PHYSICIAN:  Dr. Margarita GrizzleWoodruff  CHIEF COMPLAINT:  Left jaw, neck pain, swelling.   HISTORY OF PRESENT ILLNESS: The patient is an 81 year old white female who had a large recent stroke, which left her with the right upper extremity, right lower extremity paralysis as well as dysphagia requiring a PEG tube who [facility] and then discharged to a skilled nursing facility on the thirteenth. She was doing okay up until last week when her husband on Monday noticed that there was some swelling on the left side of her jaw. The swelling progressively got worse and started becoming red on the left aspect of her jaw. The patient started having a significant amount of pain. She was sent to the ED here and was noted to have a WBC count that was elevated at 36,000. CT scan of the neck showed left parotid gland and left submandibular gland enlargement; also adjacent mild soft tissue swelling was present. Therefore, I am asked to admit the patient. Her husband also reports that the patient has had some issues with emesis. She has had three episodes of emesis of her tube feeds. She had one today. The patient also has had issues with urinary retention since her stroke. There are no reported fevers or chills. The patient has difficulty with speech and is unable to give me any further history.   PAST MEDICAL HISTORY:  1. History of cerebrovascular accident/stroke status post TPA.  2. Irritable bowel syndrome.  3. Hypertension.  4. Chronic atrial fibrillation.  5. Hyperlipidemia.   PAST SURGICAL HISTORY:  1. Status post cataract extraction.  2. Status post cholecystectomy.  3. Status post hysterectomy.  4. Status post tonsillectomy.   ALLERGIES: None.   CURRENT MEDICATIONS:  1. Amantadine 100, 1 tab p.o. daily.  2. Aspirin 81, 2 tabs daily.   3. Atorvastatin 40 daily.  4. Cholestyramine 4 grams, one packet in 2 to 4 ounces via percutaneous endoscopic gastrostomy tube 2 times per day.  5. Cymbalta 60 daily.  6. Hydrochlorothiazide/triamterene 25/37.5, 1 tab p.o. daily.  7. Jantoven, which is Coumadin, 4 mg daily.  8. Losartan 25, 1 tab p.o. daily.  9. Mapap 1000 mg 3 times per day for pain.  10. Mirtazapine 15 mg at bedtime.  11. Propranolol 20 mg 1-1/2 tabs b.i.d.  12. Tylenol 650 mg every four hours p.r.n. for pain.   SOCIAL HISTORY: Nonsmoker. No alcohol. No drugs.   FAMILY HISTORY: Positive for hypertension. No history of stroke according to her husband.   REVIEW OF SYSTEMS: Unobtainable due to the patient's previous stroke.   PHYSICAL EXAMINATION:  VITAL SIGNS: Temperature 99, pulse 108, respiratory rate 20, blood pressure 117/63, and oxygen saturation 95%.   GENERAL: The patient appears to be a debilitated, elderly female, currently no acute distress.   HEENT: Head atraumatic, normocephalic. Pupils are equal, round, and reactive to light and accommodation. Extraocular movements intact.  Oropharynx is very dry.  Nasal exam shows no drainage or ulceration. Ear exam shows no drainage or ulceration.   NECK: There is no thyromegaly. The patient has erythema involving the left aspect of her jaw as well as tenderness. The erythema extends down to her neck. There is no fluctuance felt. There is no carotid bruit. No thyromegaly.   CARDIOVASCULAR: Irregularly irregular rhythm. There are no murmurs, rubs, clicks, or gallops. PMI  is not displaced.   LUNGS: Clear to auscultation bilaterally without any rales, rhonchi, or wheezing.   ABDOMEN: Soft, nontender, nondistended. Positive bowel sounds times four. PEG tube in place.   EXTREMITIES: No clubbing, cyanosis, or edema.   NEUROLOGICAL: The patient is flaccid on the right upper and right lower extremity, has a facial droop. Alert.   SKIN: Erythema as above described in the  neck area. No other rashes.   LYMPHATICS: No lymph nodes palpable.   MUSCULOSKELETAL: There is no erythema or swelling.   VASCULAR: Good DP, PT pulses.   PSYCHIATRIC: Not anxious or depressed.  LABORATORY, DIAGNOSTIC, AND RADIOLOGICAL DATA: CT scan of the head and neck shows left parotid gland and left submandibular gland enlargement, likely inflammatory. Salivary duct stone cannot be excluded. Adjacent mild tissue swelling is present. Component of cellulitis cannot be excluded. Soft tissue fullness in the left pharyngeal region.  Mass cannot be excluded. PA and lateral chest x-ray findings consistent with chronic obstructive pulmonary disease. No acute abnormality. WBC 36, hemoglobin 13.6, platelet count 491, glucose 166, BUN 12, creatinine 0.77, sodium 125, potassium 4, chloride 89. LFTs showed an albumin of 2.7, otherwise normal LFTs. INR is 1.3, which is subtherapeutic.   ASSESSMENT AND PLAN: The patient is an 81 year old white female with recent cerebrovascular accident who presents to the ED with left facial pain, swelling, WBC 36. 1. Left-sided cellulitis with possible parotitis, possible salivary gland inflammation:  At this time we will admit the patient, place her on IV antibiotics with Unasyn and vancomycin. We will obtain blood cultures. Due to concern for a possible stone we will have ENT evaluate the patient.  2. Recent cerebrovascular accident: Currently on aspirin and Coumadin, which we will continue.  3. Atrial fibrillation, which is chronic: INR is currently subtherapeutic. We will increase her Coumadin dose.  4. Emesis: Possibly due to ileus or constipation. We will check abdominal x-ray. We will start her on low-dose tube feeds.  5. Hypertension: We will continue propranolol, hold hydrochlorothiazide/triamterene due to hyponatremia.  6. Hyponatremia in setting of HCTZ therapy: Possible dehydration. We will give her normal saline, follow her sodium.  7. Hyperlipidemia: Continue  pravastatin.  8. CODE STATUS: DO NOT RESUSCITATE as previously.    TIME SPENT: 40 minutes.   ____________________________ Lacie Scotts Allena Katz, MD shp:bjt D: 12/13/2011 16:12:51 ET T: 12/13/2011 17:08:58 ET JOB#: [id number]  cc: Shalayna Ornstein H. Allena Katz, MD, <Dictator> Lynnea Ferrier, MD Charise Carwin MD ELECTRONICALLY SIGNED 12/22/2011 15:22
